# Patient Record
Sex: Male | Born: 1995 | Hispanic: No | Marital: Single | Smoking: Never smoker
Health system: Southern US, Community
[De-identification: ages and names within clinical notes are randomized; demographics above are authoritative.]

## PROBLEM LIST (undated history)

## (undated) DIAGNOSIS — F319 Bipolar disorder, unspecified: Secondary | ICD-10-CM

---
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
## 2013-10-13 ENCOUNTER — Emergency Department (HOSPITAL_COMMUNITY)
Admission: EM | Admit: 2013-10-13 | Discharge: 2013-10-14 | Disposition: A | Discharge status: Other Acute Inpatient Hospital | Payer: Managed Care, Other (non HMO) | Attending: Emergency Medicine | Admitting: Emergency Medicine

## 2013-10-13 ENCOUNTER — Encounter (HOSPITAL_COMMUNITY): Payer: Self-pay | Admitting: Emergency Medicine

## 2013-10-13 DIAGNOSIS — R45851 Suicidal ideations: Secondary | ICD-10-CM

## 2013-10-13 DIAGNOSIS — F3130 Bipolar disorder, current episode depressed, mild or moderate severity, unspecified: Secondary | ICD-10-CM

## 2013-10-13 DIAGNOSIS — F17200 Nicotine dependence, unspecified, uncomplicated: Secondary | ICD-10-CM | POA: Insufficient documentation

## 2013-10-13 DIAGNOSIS — Z7289 Other problems related to lifestyle: Secondary | ICD-10-CM

## 2013-10-13 DIAGNOSIS — F314 Bipolar disorder, current episode depressed, severe, without psychotic features: Secondary | ICD-10-CM

## 2013-10-13 DIAGNOSIS — X789XXA Intentional self-harm by unspecified sharp object, initial encounter: Secondary | ICD-10-CM | POA: Insufficient documentation

## 2013-10-13 DIAGNOSIS — S61509A Unspecified open wound of unspecified wrist, initial encounter: Secondary | ICD-10-CM | POA: Insufficient documentation

## 2013-10-13 DIAGNOSIS — F319 Bipolar disorder, unspecified: Secondary | ICD-10-CM | POA: Insufficient documentation

## 2013-10-13 HISTORY — DX: Bipolar disorder, unspecified: F31.9

## 2013-10-13 LAB — CBC
HEMATOCRIT: 40.8 % (ref 39.0–52.0)
HEMOGLOBIN: 14.6 g/dL (ref 13.0–17.0)
MCH: 30.5 pg (ref 26.0–34.0)
MCHC: 35.8 g/dL (ref 30.0–36.0)
MCV: 85.4 fL (ref 78.0–100.0)
PLATELETS: 263 10*3/uL (ref 150–400)
RBC: 4.78 MIL/uL (ref 4.22–5.81)
RDW: 12.6 % (ref 11.5–15.5)
WBC: 9.9 10*3/uL (ref 4.0–10.5)

## 2013-10-13 LAB — RAPID URINE DRUG SCREEN, HOSP PERFORMED
AMPHETAMINES: NOT DETECTED
BARBITURATES: NOT DETECTED
Benzodiazepines: NOT DETECTED
COCAINE: NOT DETECTED
OPIATES: NOT DETECTED
Tetrahydrocannabinol: NOT DETECTED

## 2013-10-13 LAB — COMPREHENSIVE METABOLIC PANEL
ALT: 12 U/L (ref 0–53)
AST: 23 U/L (ref 0–37)
Albumin: 4.5 g/dL (ref 3.5–5.2)
Alkaline Phosphatase: 84 U/L (ref 39–117)
BUN: 18 mg/dL (ref 6–23)
CALCIUM: 9.4 mg/dL (ref 8.4–10.5)
CO2: 21 meq/L (ref 19–32)
CREATININE: 1.01 mg/dL (ref 0.50–1.35)
Chloride: 100 mEq/L (ref 96–112)
Glucose, Bld: 101 mg/dL — ABNORMAL HIGH (ref 70–99)
Potassium: 3.9 mEq/L (ref 3.7–5.3)
SODIUM: 137 meq/L (ref 137–147)
Total Bilirubin: 0.7 mg/dL (ref 0.3–1.2)
Total Protein: 7.3 g/dL (ref 6.0–8.3)

## 2013-10-13 LAB — ETHANOL: Alcohol, Ethyl (B): <11 mg/dL (ref 0–11)

## 2013-10-13 LAB — SALICYLATE LEVEL: Salicylate Lvl: <2 mg/dL — ABNORMAL LOW (ref 2.8–20.0)

## 2013-10-13 LAB — ACETAMINOPHEN LEVEL: Acetaminophen (Tylenol), Serum: <15 ug/mL (ref 10–30)

## 2013-10-13 MED ORDER — NICOTINE 21 MG/24HR TD PT24: 21.0000 mg | MEDICATED_PATCH | Freq: Every day | TRANSDERMAL | Status: DC

## 2013-10-13 MED ORDER — ZOLPIDEM TARTRATE 5 MG PO TABS: 5.0000 mg | ORAL_TABLET | Freq: Every evening | ORAL | Status: DC | PRN

## 2013-10-13 MED ORDER — ONDANSETRON HCL 4 MG PO TABS: 4.0000 mg | ORAL_TABLET | Freq: Three times a day (TID) | ORAL | Status: DC | PRN

## 2013-10-13 MED ORDER — IBUPROFEN 400 MG PO TABS: 600.0000 mg | ORAL_TABLET | Freq: Three times a day (TID) | ORAL | Status: DC | PRN

## 2013-10-13 NOTE — BH Assessment (Signed)
Clinician contacted Ivonne AndrewPeter Dammen, PA to gather report prior to seeing pt.  Theron Aristaeter reported pt presenting suicidal with self inflicted cuts on arm. Pt has hx of depression. Assessment to be initiated.   Yaakov Guthrieelilah Stewart, MSW, LCSW Triage Specialist [phone]

## 2013-10-13 NOTE — ED Notes (Addendum)
Pt states he cut L wrist 1 hour ago because he was suicidal.  States. " I realized it was not deep enough and I was not brave enough to do it."  Denies AH/VH.  Approx 3 inch laceration to L wrist.  Bleeding controlled pta.  Bandage in place.

## 2013-10-13 NOTE — ED Notes (Signed)
MD informed of plan of care, [facility]. [facility] called and spoke with Delilah who will contact MD.

## 2013-10-13 NOTE — ED Notes (Signed)
Pt states he lives at home with his parents. States he works part time at [employer]. Admits to drinking 1-2 beers last week with a friend but states he doesn't normally drink any alcohol. He states he did smoke cigarettes last week. States he cut his wrist because "life sucks" states he should have talked to someone

## 2013-10-13 NOTE — Progress Notes (Addendum)
error 

## 2013-10-13 NOTE — BH Assessment (Signed)
Tele Assessment Note   [name] HugerCarmelo Leticia [name] is an 18 y.o. male who presents voluntarily to [facility] accompanied by his father.  Pt reported self inflicted cuts the night prior due to depressed feelings. Pt denied cutting as a suicide attempt. Pt stated depressed due to being stressed at work and he and his fiance' breaking up. Pt reported feeling overwhelmed. Pt denied current SI. Pt reported being prescribed Lamictal and Equitral by psychiatrist- Dr. Tiajuana AmassScott [name] but stated he has not taken meds in 5-6 months. Pt reported medication was not working and up until recently he was feeling better without medication. Pt reported he has not followed up with psychiatrist about not taking medication. Pt reported no previous inpatient hx and no previous cutting attempts.  Pt reported not having outpatient therapy as well. Pt reported occasional alcohol use and last use was a couple of days ago.   Pt Ox4. Pt denied HI, AVH and SA. Pt reported depressed mood and presented with congruent affect.  Pt and his dad were able to contract pt for safety. Pt's dad agreed to remove knives and other sharp objects until follow up with psychiatrist. Pt in agreance to follow up with referral to an outpatient provider.  Axis I: Bipolar, Depressed and (by history) Axis II: Deferred Axis III:  Past Medical History  Diagnosis Date  . Bipolar 1 disorder    Axis IV: other psychosocial or environmental problems and problems related to social environment  Past Medical History:  Past Medical History  Diagnosis Date  . Bipolar 1 disorder     History reviewed. No pertinent past surgical history.  Family History: No family history on file.  Social History:  reports that he has been smoking.  He does not have any smokeless tobacco history on file. He reports that he drinks alcohol. He reports that he does not use illicit drugs.  Additional Social History:  Alcohol / Drug Use Pain Medications: none currently Prescriptions:  Equital, Lamitical Over the Counter: none currently History of alcohol / drug use?: No history of alcohol / drug abuse  CIWA: CIWA-Ar BP: 134/82 mmHg Pulse Rate: 65 COWS:    Allergies: No Known Allergies  Home Medications:  (Not in a hospital admission)  OB/GYN Status:  No LMP for male patient.  General Assessment Data [facility] Is this a Tele or Face-to-Face Assessment?: Tele Assessment Is this an Initial Assessment or a Re-assessment for this encounter?: Initial Assessment Living Arrangements: Parent Can pt return to current living arrangement?: Yes Admission Status: Voluntary Is patient capable of signing voluntary admission?: Yes Transfer from: Acute Hospital Referral Source: Self/Family/Friend  Medical Screening Exam [facility] Walk-in ONLY) Medical Exam completed:  (NA)  [facility] Living Arrangements: Parent Name of Psychiatrist:  (Dr. Tiajuana AmassScott [name]) Name of Therapist:  (none)     Risk to self Suicidal Ideation: No-Not Currently/Within Last 6 Months Suicidal Intent: No Is patient at risk for suicide?: No Suicidal Plan?: No Access to Means: Yes Specify Access to Suicidal Means:  (Pt had passive SI cutting behaviors. ) What has been your use of drugs/alcohol within the last 12 months?:  (none ) Previous Attempts/Gestures: No How many times?: 0 Other Self Harm Risks:  (yes) Triggers for Past Attempts: None known Intentional Self Injurious Behavior: Cutting Comment - Self Injurious Behavior:  (Pt has self inflicted cuts on arm.) Family Suicide History: Unknown Recent stressful life event(s): Conflict (Comment);Other (Comment) (Pt broke up with girlfriend and increased stress at work. )  Persecutory voices/beliefs?: No Depression: Yes Depression Symptoms: Despondent;Tearfulness;Fatigue Substance abuse history and/or treatment for substance abuse?: No Suicide prevention information given to non-admitted patients: Yes  Risk to  Others Homicidal Ideation: No Thoughts of Harm to Others: No Current Homicidal Intent: No Current Homicidal Plan: No Access to Homicidal Means: No Identified Victim:  (NA) History of harm to others?: No Assessment of Violence: None Noted Violent Behavior Description:  (Pt was calm and cooperative. ) Does patient have access to weapons?: No Criminal Charges Pending?: No Does patient have a court date: No  Psychosis Hallucinations: None noted Delusions: None noted  Mental Status Report Appear/Hygiene: In scrubs Eye Contact: Fair Motor Activity: Unremarkable Speech: Logical/coherent Level of Consciousness: Alert Mood: Depressed Affect: Depressed Anxiety Level: Minimal Thought Processes: Coherent;Relevant Judgement: Impaired Orientation: Place;Person;Time;Situation Obsessive Compulsive Thoughts/Behaviors: Minimal  Cognitive Functioning Concentration: Normal Memory: Recent Intact;Remote Intact IQ: Average Insight: Poor Impulse Control: Poor Appetite: Fair Weight Loss:  (unk) Weight Gain:  (unk) Sleep: Increased Total Hours of Sleep: 8 Vegetative Symptoms: None  ADLScreening [facility] Assessment Services) Patient's cognitive ability adequate to safely complete daily activities?: Yes Patient able to express need for assistance with ADLs?: Yes Independently performs ADLs?: Yes (appropriate for developmental age)  Prior Inpatient Therapy Prior Inpatient Therapy: No Prior Therapy Dates:  (NA) Prior Therapy Facilty/Provider(s):  (NA) Reason for Treatment:  (NA)  Prior Outpatient Therapy Prior Outpatient Therapy: No Prior Therapy Dates:  (NA) Prior Therapy Facilty/Provider(s):  (NA) Reason for Treatment:  (NA)  ADL Screening (condition at time of admission) Patient's cognitive ability adequate to safely complete daily activities?: Yes Is the patient deaf or have difficulty hearing?: No Does the patient have difficulty seeing, even when wearing glasses/contacts?:  No Does the patient have difficulty concentrating, remembering, or making decisions?: No Patient able to express need for assistance with ADLs?: Yes Does the patient have difficulty dressing or bathing?: No Independently performs ADLs?: Yes (appropriate for developmental age)       Abuse/Neglect Assessment (Assessment to be complete while patient is alone) Physical Abuse: Denies Verbal Abuse: Denies Sexual Abuse: Denies Exploitation of patient/patient's resources: Denies Self-Neglect: Denies Values / Beliefs Cultural Requests During Hospitalization: None Spiritual Requests During Hospitalization: None   Advance Directives (For Healthcare) Advance Directive: Patient does not have advance directive    Additional Information 1:1 In Past 12 Months?: No CIRT Risk: No Elopement Risk: No Does patient have medical clearance?: Yes    Disposition: Clinician consulted with Alberteen SamFran Hobson, NP who reports pt does not meet criteria for inpatient admission.  Dr. Rhunette CroftNanavati requests pt be seen by psych in the AM prior to discharge.   Disposition Initial Assessment Completed for this Encounter: Yes Disposition of Patient: Referred to Patient referred to: Other (Comment) (EDP request pt to be seen my psych in the AM.)  Stewart,Samarra Ridgely R 10/13/2013 6:51 AM

## 2013-10-13 NOTE — ED Provider Notes (Signed)
CSN: [id number]     Arrival date & time 10/13/13  0255 History   First MD Initiated Contact with Patient 10/13/13 0414     Chief Complaint  Patient presents with  . Extremity Laceration  . Suicidal   HPI  History provided by the patient. The patient is a 18 year old male with past history of bipolar disorder presenting with worsening depression and suicidal ideation with laceration to left wrist. Patient states that his life has felt miserable and he is very depressed and not happy. He was at home and reports using a kitchen knife to cut his left wrist. He states he was feeling suicidal but also states he knows he was not to be able to cut himself deeply enough to die. Was seen by psychiatrist in the past for his bipolar depression symptoms but has not been taking any of the prescribed medicine for the past 5-6 months. He denies any hallucinations. No homicidal ideations. Denies any drug or alcohol use. No other aggravating or alleviating factors. No other associated symptoms.     Past Medical History  Diagnosis Date  . Bipolar 1 disorder    History reviewed. No pertinent past surgical history. No family history on file. History  Substance Use Topics  . Smoking status: Current Some Day Smoker  . Smokeless tobacco: Not on file  . Alcohol Use: Yes    Review of Systems  All other systems reviewed and are negative.     Allergies  Review of patient's allergies indicates no known allergies.  Home Medications   Prior to Admission medications   Medication Sig Start Date End Date Taking? Authorizing Provider  Multiple Vitamins-Minerals (MULTIVITAMIN WITH MINERALS) tablet Take 2 tablets by mouth daily. gummy   Yes Historical Provider, MD   BP 163/95  Pulse 73  Temp(Src) 98.2 F (36.8 C) (Oral)  Resp 18  SpO2 98% Physical Exam  Nursing note and vitals reviewed. Constitutional: He appears well-developed and well-nourished.  HENT:  Head: Normocephalic.  Cardiovascular: Normal  rate and regular rhythm.   Pulmonary/Chest: Effort normal and breath sounds normal. No respiratory distress.  Abdominal: Soft.  Musculoskeletal:  Superficial laceration across the left wrist. No deep structure involvement or tendon involvement. Normal grip strength and range of motion at the wrist. Normal distal pulses and capillary refill.  Psychiatric: He exhibits a depressed mood. He expresses suicidal ideation.    ED Course  Procedures   COORDINATION OF CARE:  Nursing notes reviewed. Vital signs reviewed. Initial pt interview and examination performed.   Filed Vitals:   10/13/13 0258  BP: 163/95  Pulse: 73  Temp: 98.2 F (36.8 C)  TempSrc: Oral  Resp: 18  SpO2: 98%    4:37 AM-Patient seen and evaluated. Patient does not appear in any acute distress. Does report suicidal ideations and self cutting.  TTS consult placed. Psychiatric holding orders in place.      LACERATION REPAIR Performed by: Angus SellerAMMEN,Crue Otero S Authorized by: Angus SellerAMMEN,Adaleen Hulgan S Consent: Verbal consent obtained. Risks and benefits: risks, benefits and alternatives were discussed Consent given by: patient Patient identity confirmed: provided demographic data Prepped and Draped in normal sterile fashion Wound explored  Laceration Location: left wirst  Laceration Length: 4.5cm  No Foreign Bodies seen or palpated  Anesthesia: local infiltration  Local anesthetic: lidocaine 2% without epinephrine  Anesthetic total: 2 ml  Irrigation method: syringe Amount of cleaning: standard  Skin closure: dermabond  Patient tolerance: Patient tolerated the procedure well with no immediate complications.   Results for  orders placed during the hospital encounter of 10/13/13  ACETAMINOPHEN LEVEL      Result Value Ref Range   Acetaminophen (Tylenol), Serum <15.0  10 - 30 ug/mL  CBC      Result Value Ref Range   WBC 9.9  4.0 - 10.5 K/uL   RBC 4.78  4.22 - 5.81 MIL/uL   Hemoglobin 14.6  13.0 - 17.0 g/dL   HCT  16.140.8  09.639.0 - 04.552.0 %   MCV 85.4  78.0 - 100.0 fL   MCH 30.5  26.0 - 34.0 pg   MCHC 35.8  30.0 - 36.0 g/dL   RDW 40.912.6  81.111.5 - 91.415.5 %   Platelets 263  150 - 400 K/uL  COMPREHENSIVE METABOLIC PANEL      Result Value Ref Range   Sodium 137  137 - 147 mEq/L   Potassium 3.9  3.7 - 5.3 mEq/L   Chloride 100  96 - 112 mEq/L   CO2 21  19 - 32 mEq/L   Glucose, Bld 101 (*) 70 - 99 mg/dL   BUN 18  6 - 23 mg/dL   Creatinine, Ser 7.821.01  0.50 - 1.35 mg/dL   Calcium 9.4  8.4 - 95.610.5 mg/dL   Total Protein 7.3  6.0 - 8.3 g/dL   Albumin 4.5  3.5 - 5.2 g/dL   AST 23  0 - 37 U/L   ALT 12  0 - 53 U/L   Alkaline Phosphatase 84  39 - 117 U/L   Total Bilirubin 0.7  0.3 - 1.2 mg/dL   GFR calc non Af Amer >90  >90 mL/min   GFR calc Af Amer >90  >90 mL/min  ETHANOL      Result Value Ref Range   Alcohol, Ethyl (B) <11  0 - 11 mg/dL  SALICYLATE LEVEL      Result Value Ref Range   Salicylate Lvl <2.0 (*) 2.8 - 20.0 mg/dL  URINE RAPID DRUG SCREEN (HOSP PERFORMED)      Result Value Ref Range   Opiates NONE DETECTED  NONE DETECTED   Cocaine NONE DETECTED  NONE DETECTED   Benzodiazepines NONE DETECTED  NONE DETECTED   Amphetamines NONE DETECTED  NONE DETECTED   Tetrahydrocannabinol NONE DETECTED  NONE DETECTED   Barbiturates NONE DETECTED  NONE DETECTED      MDM   Final diagnoses:  Suicidal ideation  Deliberate self-cutting       Angus Sellereter S Derak Schurman, PA-C 10/13/13 [phone]

## 2013-10-13 NOTE — Discharge Instructions (Signed)
Please followup with your doctor and psychiatrist for continued evaluation and treatment of your symptoms.    Depression, Adult Depression refers to feeling sad, low, down in the dumps, blue, gloomy, or empty. In general, there are two kinds of depression: 1. Depression that we all experience from time to time because of upsetting life experiences, including the loss of a job or the ending of a relationship (normal sadness or normal grief). This kind of depression is considered normal, is short lived, and resolves within a few days to 2 weeks. (Depression experienced after the loss of a loved one is called bereavement. Bereavement often lasts longer than 2 weeks but normally gets better with time.) 2. Clinical depression, which lasts longer than normal sadness or normal grief or interferes with your ability to function at home, at work, and in school. It also interferes with your personal relationships. It affects almost every aspect of your life. Clinical depression is an illness. Symptoms of depression also can be caused by conditions other than normal sadness and grief or clinical depression. Examples of these conditions are listed as follows:  Physical illness Some physical illnesses, including underactive thyroid gland (hypothyroidism), severe anemia, specific types of cancer, diabetes, uncontrolled seizures, heart and lung problems, strokes, and chronic pain are commonly associated with symptoms of depression.  Side effects of some prescription medicine In some people, certain types of prescription medicine can cause symptoms of depression.  Substance abuse Abuse of alcohol and illicit drugs can cause symptoms of depression. SYMPTOMS Symptoms of normal sadness and normal grief include the following:  Feeling sad or crying for short periods of time.  Not caring about anything (apathy).  Difficulty sleeping or sleeping too much.  No longer able to enjoy the things you used to  enjoy.  Desire to be by oneself all the time (social isolation).  Lack of energy or motivation.  Difficulty concentrating or remembering.  Change in appetite or weight.  Restlessness or agitation. Symptoms of clinical depression include the same symptoms of normal sadness or normal grief and also the following symptoms:  Feeling sad or crying all the time.  Feelings of guilt or worthlessness.  Feelings of hopelessness or helplessness.  Thoughts of suicide or the desire to harm yourself (suicidal ideation).  Loss of touch with reality (psychotic symptoms). Seeing or hearing things that are not real (hallucinations) or having false beliefs about your life or the people around you (delusions and paranoia). DIAGNOSIS  The diagnosis of clinical depression usually is based on the severity and duration of the symptoms. Your caregiver also will ask you questions about your medical history and substance use to find out if physical illness, use of prescription medicine, or substance abuse is causing your depression. Your caregiver also may order blood tests. TREATMENT  Typically, normal sadness and normal grief do not require treatment. However, sometimes antidepressant medicine is prescribed for bereavement to ease the depressive symptoms until they resolve. The treatment for clinical depression depends on the severity of your symptoms but typically includes antidepressant medicine, counseling with a mental health professional, or a combination of both. Your caregiver will help to determine what treatment is best for you. Depression caused by physical illness usually goes away with appropriate medical treatment of the illness. If prescription medicine is causing depression, talk with your caregiver about stopping the medicine, decreasing the dose, or substituting another medicine. Depression caused by abuse of alcohol or illicit drugs abuse goes away with abstinence from these substances. Some  adults need professional help in order to stop drinking or using drugs. SEEK IMMEDIATE CARE IF:  You have thoughts about hurting yourself or others.  You lose touch with reality (have psychotic symptoms).  You are taking medicine for depression and have a serious side effect. FOR MORE INFORMATION National Alliance on Mental Illness: www.nami.[employer]: http://www.maynard.net/ Document Released: 04/14/2000 Document Revised: 10/17/2011 Document Reviewed: 07/17/2011 [facility] Patient Information 2014 [place], [place].   Laceration Care, Adult A laceration is a cut or lesion that goes through all layers of the skin and into the tissue just beneath the skin. TREATMENT  Some lacerations may not require closure. Some lacerations may not be able to be closed due to an increased risk of infection. It is important to see your caregiver as soon as possible after an injury to minimize the risk of infection and maximize the opportunity for successful closure. If closure is appropriate, pain medicines may be given, if needed. The wound will be cleaned to help prevent infection. Your caregiver will use stitches (sutures), staples, wound glue (adhesive), or skin adhesive strips to repair the laceration. These tools bring the skin edges together to allow for faster healing and a better cosmetic outcome. However, all wounds will heal with a scar. Once the wound has healed, scarring can be minimized by covering the wound with sunscreen during the day for 1 full year. HOME CARE INSTRUCTIONS  For sutures or staples:  Keep the wound clean and dry.  If you were given a bandage (dressing), you should change it at least once a day. Also, change the dressing if it becomes wet or dirty, or as directed by your caregiver.  Wash the wound with soap and water 2 times a day. Rinse the wound off with water to remove all soap. Pat the wound dry with a clean towel.  After cleaning, apply a thin  layer of the antibiotic ointment as recommended by your caregiver. This will help prevent infection and keep the dressing from sticking.  You may shower as usual after the first 24 hours. Do not soak the wound in water until the sutures are removed.  Only take over-the-counter or prescription medicines for pain, discomfort, or fever as directed by your caregiver.  Get your sutures or staples removed as directed by your caregiver. For skin adhesive strips:  Keep the wound clean and dry.  Do not get the skin adhesive strips wet. You may bathe carefully, using caution to keep the wound dry.  If the wound gets wet, pat it dry with a clean towel.  Skin adhesive strips will fall off on their own. You may trim the strips as the wound heals. Do not remove skin adhesive strips that are still stuck to the wound. They will fall off in time. For wound adhesive:  You may briefly wet your wound in the shower or bath. Do not soak or scrub the wound. Do not swim. Avoid periods of heavy perspiration until the skin adhesive has fallen off on its own. After showering or bathing, gently pat the wound dry with a clean towel.  Do not apply liquid medicine, cream medicine, or ointment medicine to your wound while the skin adhesive is in place. This may loosen the film before your wound is healed.  If a dressing is placed over the wound, be careful not to apply tape directly over the skin adhesive. This may cause the adhesive to be pulled off before the wound is healed.  Avoid prolonged exposure to sunlight or tanning lamps while the skin adhesive is in place. Exposure to ultraviolet light in the first year will darken the scar.  The skin adhesive will usually remain in place for 5 to 10 days, then naturally fall off the skin. Do not pick at the adhesive film. You may need a tetanus shot if:  You cannot remember when you had your last tetanus shot.  You have never had a tetanus shot. If you get a tetanus  shot, your arm may swell, get red, and feel warm to the touch. This is common and not a problem. If you need a tetanus shot and you choose not to have one, there is a rare chance of getting tetanus. Sickness from tetanus can be serious. SEEK MEDICAL CARE IF:   You have redness, swelling, or increasing pain in the wound.  You see a red line that goes away from the wound.  You have yellowish-white fluid (pus) coming from the wound.  You have a fever.  You notice a bad smell coming from the wound or dressing.  Your wound breaks open before or after sutures have been removed.  You notice something coming out of the wound such as wood or glass.  Your wound is on your hand or foot and you cannot move a finger or toe. SEEK IMMEDIATE MEDICAL CARE IF:   Your pain is not controlled with prescribed medicine.  You have severe swelling around the wound causing pain and numbness or a change in color in your arm, hand, leg, or foot.  Your wound splits open and starts bleeding.  You have worsening numbness, weakness, or loss of function of any joint around or beyond the wound.  You develop painful lumps near the wound or on the skin anywhere on your body. MAKE SURE YOU:   Understand these instructions.  Will watch your condition.  Will get help right away if you are not doing well or get worse. Document Released: 04/17/2005 Document Revised: 07/10/2011 Document Reviewed: 10/11/2010 [facility] Patient Information 2014 [place], [place].

## 2013-10-13 NOTE — ED Notes (Signed)
[facility] if pt signs a no harm contract and follows up with a psychiatrist within 10 days. Pt states he will comply. Pt's father in agreement.

## 2013-10-13 NOTE — BH Assessment (Signed)
Clinician followed up with Dr. Rhunette CroftNanavati about discharge plan. Dr requests pt to be seen by psych to discuss current med non-compliance and safety issues prior to discharge from hospital.   [name] [name], MSW, LCSW Triage Specialist [phone]

## 2013-10-13 NOTE — BH Assessment (Signed)
Patient is pending a telepsych. Writer notified Renata Capriceonrad, NP of telepsych need. Sts that he will now being doing telepsych's after 12 noon for the ED's as he was directed to do so by Dr. Lucianne MussKumar. Writer notified patient's nurse-Karen. Writer will touch base with patient's nurse regarding telepsych time around noon.

## 2013-10-13 NOTE — ED Notes (Signed)
Breakfast tray ordered for pt

## 2013-10-13 NOTE — ED Notes (Signed)
PA suturing pt's laceration at this time.

## 2013-10-13 NOTE — BH Assessment (Signed)
Clinician consulted with [name] SamFran Hobson, NP who reports pt does not meet criteria for inpatient admission. Pt able to contract for safety. [name] [name] recommends pt follow up with psychiatrist within 10 days, connect with outpatient provider for therapy and sign no harm contract. Clinician will fax information to [name] JoinerRebecca, RN to provide to patient upon discharge. Clinician provided updates to [name] AndrewPeter Dammen, PA who agrees with recommendations.   [name] [name], MSW, LCSW Triage Specialist [phone]

## 2013-10-13 NOTE — Progress Notes (Signed)
P 

## 2013-10-13 NOTE — ED Notes (Signed)
MD at bedside. 

## 2013-10-13 NOTE — ED Notes (Signed)
TTS at BS 

## 2013-10-13 NOTE — Consult Note (Signed)
[facility] Face-to-Face Psychiatry Consult   Reason for Consult:  Bipolar and SIB and Suicidal attempt Referring Physician:  Dr. Halina Andreas Villamar is an 17 y.o. male. Total Time spent with patient: 45 minutes  Assessment: AXIS I:  Bipolar, Depressed AXIS II:  Deferred AXIS III:   Past Medical History  Diagnosis Date  . Bipolar 1 disorder    AXIS IV:  other psychosocial or environmental problems, problems related to social environment and problems with primary support group AXIS V:  41-50 serious symptoms  Plan:  Recommend psychiatric Inpatient admission when medically cleared. Supportive therapy provided about ongoing stressors.  Subjective:   [name] [name] is a 18 y.o. male patient admitted with bipolar and suicidal attempt / SIB.  HPI:  Patient is seen, chart reviewed, and case discussed with patient dad who is at bed side. Patient has been struggling with increased symptoms of depression, anxiety, anger and been stressed about stress from work x 3 weeks and broke up with relationship x 3 years. Patient has been hopeless, helpless and felt worthless and has disturbed sleep and has planned suicidal attempt. He has cut on his wrist several time with razor blade in shower and than later put ice to numb his fore arm and cut with kitchen knife in his room and than woke up his dad to seek help. Patient reportedly received unknown medication for anxiety from a PCP which did not help anxiety but helped nausea and vomiting about three years ago and than was referred to psychiatrist who provided out patient care about two years but failed to comply with medication saying he was continue to be angry on his bipolar medications. He was taken his medication about six months ago. Reportedly he is one of the five siblings and older one. He has stated that he does not know if he is suicidal or not when asked during the evaluation. Patient and his father agree with in patient hospitalization for safety,  appropriate medication management and diagnostic clarification and crisis stabilization. Patient has no history of substance abuse and no previous psych admission.   HPI Elements:   Location:  depression and mood swings. Quality:  poor. Severity:  acute. Timing:  suicidal attmpt.  Past Psychiatric History: Past Medical History  Diagnosis Date  . Bipolar 1 disorder     reports that he has been smoking.  He does not have any smokeless tobacco history on file. He reports that he drinks alcohol. He reports that he does not use illicit drugs. No family history on file. Family History Substance Abuse: No Family Supports: Yes, List: (dad and mom) Living Arrangements: Parent Can pt return to current living arrangement?: Yes Abuse/Neglect [facility]) Physical Abuse: Denies Verbal Abuse: Denies Sexual Abuse: Denies Allergies:  No Known Allergies  ACT Assessment Complete:  Yes:    Educational Status    Risk to Self: Risk to self Suicidal Ideation: No-Not Currently/Within Last 6 Months Suicidal Intent: No Is patient at risk for suicide?: No Suicidal Plan?: No Access to Means: Yes Specify Access to Suicidal Means:  (Pt had passive SI cutting behaviors. ) What has been your use of drugs/alcohol within the last 12 months?:  (none ) Previous Attempts/Gestures: No How many times?: 0 Other Self Harm Risks:  (yes) Triggers for Past Attempts: None known Intentional Self Injurious Behavior: Cutting Comment - Self Injurious Behavior:  (Pt has self inflicted cuts on arm.) Family Suicide History: Unknown Recent stressful life event(s): Conflict (Comment);Other (Comment) (Pt broke up with girlfriend  and increased stress at work. ) Persecutory voices/beliefs?: No Depression: Yes Depression Symptoms: Despondent;Tearfulness;Fatigue Substance abuse history and/or treatment for substance abuse?: No Suicide prevention information given to non-admitted patients: Yes  Risk to Others: Risk to  Others Homicidal Ideation: No Thoughts of Harm to Others: No Current Homicidal Intent: No Current Homicidal Plan: No Access to Homicidal Means: No Identified Victim:  (NA) History of harm to others?: No Assessment of Violence: None Noted Violent Behavior Description:  (Pt was calm and cooperative. ) Does patient have access to weapons?: No Criminal Charges Pending?: No Does patient have a court date: No  Abuse: Abuse/Neglect Assessment (Assessment to be complete while patient is alone) Physical Abuse: Denies Verbal Abuse: Denies Sexual Abuse: Denies Exploitation of patient/patient's resources: Denies Self-Neglect: Denies  Prior Inpatient Therapy: Prior Inpatient Therapy Prior Inpatient Therapy: No Prior Therapy Dates:  (NA) Prior Therapy Facilty/Provider(s):  (NA) Reason for Treatment:  (NA)  Prior Outpatient Therapy: Prior Outpatient Therapy Prior Outpatient Therapy: No Prior Therapy Dates:  (NA) Prior Therapy Facilty/Provider(s):  (NA) Reason for Treatment:  (NA)  Additional Information: Additional Information 1:1 In Past 12 Months?: No CIRT Risk: No Elopement Risk: No Does patient have medical clearance?: Yes    Objective: Blood pressure 123/67, pulse 70, temperature 98.2 F (36.8 C), temperature source Oral, resp. rate 26, SpO2 99.00%.There is no height or weight on file to calculate BMI. Results for orders placed during the hospital encounter of 10/13/13 (from the past 72 hour(s))  ACETAMINOPHEN LEVEL     Status: None   Collection Time    10/13/13  3:16 AM      Result Value Ref Range   Acetaminophen (Tylenol), Serum <15.0  10 - 30 ug/mL   Comment:            THERAPEUTIC CONCENTRATIONS VARY     SIGNIFICANTLY. A RANGE OF 10-30     ug/mL MAY BE AN EFFECTIVE     CONCENTRATION FOR MANY PATIENTS.     HOWEVER, SOME ARE BEST TREATED     AT CONCENTRATIONS OUTSIDE THIS     RANGE.     ACETAMINOPHEN CONCENTRATIONS     >150 ug/mL AT 4 HOURS AFTER     INGESTION AND >50  ug/mL AT 12     HOURS AFTER INGESTION ARE     OFTEN ASSOCIATED WITH TOXIC     REACTIONS.  CBC     Status: None   Collection Time    10/13/13  3:16 AM      Result Value Ref Range   WBC 9.9  4.0 - 10.5 K/uL   RBC 4.78  4.22 - 5.81 MIL/uL   Hemoglobin 14.6  13.0 - 17.0 g/dL   HCT 40.8  39.0 - 52.0 %   MCV 85.4  78.0 - 100.0 fL   MCH 30.5  26.0 - 34.0 pg   MCHC 35.8  30.0 - 36.0 g/dL   RDW 12.6  11.5 - 15.5 %   Platelets 263  150 - 400 K/uL  COMPREHENSIVE METABOLIC PANEL     Status: Abnormal   Collection Time    10/13/13  3:16 AM      Result Value Ref Range   Sodium 137  137 - 147 mEq/L   Potassium 3.9  3.7 - 5.3 mEq/L   Comment: HEMOLYSIS AT THIS LEVEL MAY AFFECT RESULT   Chloride 100  96 - 112 mEq/L   CO2 21  19 - 32 mEq/L   Glucose, Bld 101 (*) 70 -  99 mg/dL   BUN 18  6 - 23 mg/dL   Creatinine, Ser 1.01  0.50 - 1.35 mg/dL   Calcium 9.4  8.4 - 10.5 mg/dL   Total Protein 7.3  6.0 - 8.3 g/dL   Albumin 4.5  3.5 - 5.2 g/dL   AST 23  0 - 37 U/L   ALT 12  0 - 53 U/L   Alkaline Phosphatase 84  39 - 117 U/L   Total Bilirubin 0.7  0.3 - 1.2 mg/dL   GFR calc non Af Amer >90  >90 mL/min   GFR calc Af Amer >90  >90 mL/min   Comment: (NOTE)     The eGFR has been calculated using the CKD EPI equation.     This calculation has not been validated in all clinical situations.     eGFR's persistently <90 mL/min signify possible Chronic Kidney     Disease.  ETHANOL     Status: None   Collection Time    10/13/13  3:16 AM      Result Value Ref Range   Alcohol, Ethyl (B) <11  0 - 11 mg/dL   Comment:            LOWEST DETECTABLE LIMIT FOR     SERUM ALCOHOL IS 11 mg/dL     FOR MEDICAL PURPOSES ONLY  SALICYLATE LEVEL     Status: Abnormal   Collection Time    10/13/13  3:16 AM      Result Value Ref Range   Salicylate Lvl <9.2 (*) 2.8 - 20.0 mg/dL  URINE RAPID DRUG SCREEN (HOSP PERFORMED)     Status: None   Collection Time    10/13/13  4:27 AM      Result Value Ref Range   Opiates NONE  DETECTED  NONE DETECTED   Cocaine NONE DETECTED  NONE DETECTED   Benzodiazepines NONE DETECTED  NONE DETECTED   Amphetamines NONE DETECTED  NONE DETECTED   Tetrahydrocannabinol NONE DETECTED  NONE DETECTED   Barbiturates NONE DETECTED  NONE DETECTED   Comment:            DRUG SCREEN FOR MEDICAL PURPOSES     ONLY.  IF CONFIRMATION IS NEEDED     FOR ANY PURPOSE, NOTIFY LAB     WITHIN 5 DAYS.                LOWEST DETECTABLE LIMITS     FOR URINE DRUG SCREEN     Drug Class       Cutoff (ng/mL)     Amphetamine      1000     Barbiturate      200     Benzodiazepine   330     Tricyclics       076     Opiates          300     Cocaine          300     THC              50   Labs are reviewed and are pertinent for WNL.  Current Facility-Administered Medications  Medication Dose Route Frequency Provider Last Rate Last Dose  . ibuprofen (ADVIL,MOTRIN) tablet 600 mg  600 mg Oral Q8H PRN Ruthell Rummage Dammen, PA-C      . nicotine (NICODERM CQ - dosed in mg/24 hours) patch 21 mg  21 mg Transdermal Daily Ruthell Rummage Dammen, PA-C      .  ondansetron (ZOFRAN) tablet 4 mg  4 mg Oral Q8H PRN Ruthell Rummage Dammen, PA-C      . zolpidem (AMBIEN) tablet 5 mg  5 mg Oral QHS PRN Martie Lee, PA-C       Current Outpatient Prescriptions  Medication Sig Dispense Refill  . Multiple Vitamins-Minerals (MULTIVITAMIN WITH MINERALS) tablet Take 2 tablets by mouth daily. gummy        Psychiatric Specialty Exam: Physical Exam Full physical performed in Emergency Department. I have reviewed this assessment and concur with its findings.   Review of Systems  Psychiatric/Behavioral: Positive for depression and suicidal ideas. The patient is nervous/anxious and has insomnia.   All other systems reviewed and are negative.   Blood pressure 123/67, pulse 70, temperature 98.2 F (36.8 C), temperature source Oral, resp. rate 26, SpO2 99.00%.There is no height or weight on file to calculate BMI.  General Appearance: Guarded and  laceration on his left wrist which required sutures  Eye Contact::  Good  Speech:  Clear and Coherent  Volume:  Decreased  Mood:  Angry, Anxious, Depressed, Hopeless, Irritable and Worthless  Affect:  Depressed and Flat  Thought Process:  Coherent and Goal Directed  Orientation:  Full (Time, Place, and Person)  Thought Content:  Rumination  Suicidal Thoughts:  Yes.  with intent/plan  Homicidal Thoughts:  No  Memory:  Immediate;   Good Recent;   Good  Judgement:  Impaired  Insight:  Lacking  Psychomotor Activity:  Decreased  Concentration:  Fair  Recall:  Good  Fund of Knowledge:Good  Language: Good  Akathisia:  NA  Handed:  Right  AIMS (if indicated):     Assets:  Communication Skills Desire for Improvement Financial Resources/Insurance Housing Leisure [place] Talents/Skills Transportation  Sleep:      Musculoskeletal: Strength & Muscle Tone: within normal limits Gait & Station: normal Patient leans: N/A  Treatment Plan Summary: Daily contact with patient to assess and evaluate symptoms and progress in treatment Medication management  [name] [name],JANARDHAHA R. 10/13/2013 12:08 PM

## 2013-10-14 ENCOUNTER — Encounter (HOSPITAL_COMMUNITY): Payer: Self-pay | Admitting: Intensive Care

## 2013-10-14 ENCOUNTER — Inpatient Hospital Stay (HOSPITAL_COMMUNITY)
Admission: AD | Admit: 2013-10-14 | Discharge: 2013-10-17 | DRG: 885 | Disposition: A | Discharge status: Home / Self Care | Payer: Managed Care, Other (non HMO) | Source: Intra-hospital | Attending: Psychiatry | Admitting: Psychiatry

## 2013-10-14 DIAGNOSIS — F339 Major depressive disorder, recurrent, unspecified: Secondary | ICD-10-CM

## 2013-10-14 DIAGNOSIS — F314 Bipolar disorder, current episode depressed, severe, without psychotic features: Principal | ICD-10-CM | POA: Diagnosis present

## 2013-10-14 DIAGNOSIS — F17200 Nicotine dependence, unspecified, uncomplicated: Secondary | ICD-10-CM | POA: Diagnosis present

## 2013-10-14 DIAGNOSIS — F332 Major depressive disorder, recurrent severe without psychotic features: Secondary | ICD-10-CM

## 2013-10-14 DIAGNOSIS — G4700 Insomnia, unspecified: Secondary | ICD-10-CM | POA: Diagnosis present

## 2013-10-14 DIAGNOSIS — F4322 Adjustment disorder with anxiety: Secondary | ICD-10-CM | POA: Diagnosis present

## 2013-10-14 DIAGNOSIS — F331 Major depressive disorder, recurrent, moderate: Secondary | ICD-10-CM

## 2013-10-14 MED ORDER — ACETAMINOPHEN 325 MG PO TABS: 650.0000 mg | ORAL_TABLET | Freq: Four times a day (QID) | ORAL | Status: DC | PRN

## 2013-10-14 MED ORDER — HYDROXYZINE HCL 25 MG PO TABS: 25.0000 mg | ORAL_TABLET | Freq: Four times a day (QID) | ORAL | Status: DC | PRN

## 2013-10-14 MED ORDER — TRAZODONE HCL 50 MG PO TABS
50.0000 mg | ORAL_TABLET | Freq: Every evening | ORAL | Status: DC | PRN
  Administered 2013-10-14 – 2013-10-16 (×3): 50 mg via ORAL
  Filled 2013-10-14 (×11): qty 1

## 2013-10-14 MED ORDER — ALUM & MAG HYDROXIDE-SIMETH 200-200-20 MG/5ML PO SUSP: 30.0000 mL | ORAL | Status: DC | PRN

## 2013-10-14 MED ORDER — MAGNESIUM HYDROXIDE 400 MG/5ML PO SUSP: 30.0000 mL | Freq: Every day | ORAL | Status: DC | PRN

## 2013-10-14 MED ORDER — ADULT MULTIVITAMIN W/MINERALS CH
1.0000 | ORAL_TABLET | Freq: Every day | ORAL | Status: DC
  Administered 2013-10-16 – 2013-10-17 (×2): 1 via ORAL
  Filled 2013-10-14 (×7): qty 1

## 2013-10-14 NOTE — ED Notes (Signed)
Patient has signed consent to go to [facility].

## 2013-10-14 NOTE — ED Notes (Signed)
Patient belongings sent with him to [facility] via [facility]

## 2013-10-14 NOTE — Progress Notes (Signed)
Patient denied SI and HI.  Contracts for safety.  Denied A/V hallucinations.  Denied pain.  Stated he would let nurse know if he needed any medications/assistance.  Patient went to recreation this afternoon.  Smiling and talking to peers on unit.  Pleasant and cooperative.

## 2013-10-14 NOTE — ED Provider Notes (Signed)
[facility] to [facility].   No current distress, vital signs are unremarkable.   Gerhard Munchobert Lockwood, MD 10/14/13 1426

## 2013-10-14 NOTE — ED Provider Notes (Signed)
Shared service with midlevel provider. I have personally seen and examined the patient, providing direct face to face care, presenting with the chief complaint of suicidal ideation and cut to the left wrist. Physical exam findings include left wrist injury- already stitched up. Patient is not taking his bipoar meds, and seems like a few negative events just occurred in his life that triggered such reaction. Plan will be for Psychiatrist to assess the patient and help with disposition. I have asked the behavioral team that a Psychiatrist needs to fully assess this patient. I have reviewed the nursing documentation on past medical history, family history, and social history.   Derwood KaplanAnkit Arliss Hepburn, MD 10/14/13 [phone]

## 2013-10-14 NOTE — ED Notes (Signed)
Called [facility] for transport.  

## 2013-10-14 NOTE — Progress Notes (Signed)
D: Patient in the dayroom on approach.  Patient animated and cooperative.  Patient had minimal interaction with Buyer, retailwriter tonight.  Patient states he is still trying to get adjusted to the unit.  Patient currently denies SI/HI and denies AVH.  Patient states he wants to make sure his depression never gets out of hand like it did prior to admission and states he needs to learn coping mechanisms. A: Staff to monitor Q 15 mins for safety.  Encouragement and support offered.  Scheduled medications administered per orders. R: Patient remains safe on the unit.  Patient attended group tonight.  Patient visible on hte unit and interacting with peers.  Patient taking administered medications.

## 2013-10-14 NOTE — Tx Team (Signed)
Initial Interdisciplinary Treatment Plan  PATIENT STRENGTHS: (choose at least two) Ability for insight Active sense of humor Average or above average intelligence Capable of independent living Communication skills Motivation for treatment/growth Supportive family/friends  PATIENT STRESSORS: Financial difficulties Marital or family conflict Medication change or noncompliance   PROBLEM LIST: Problem List/Patient Goals Date to be addressed Date deferred Reason deferred Estimated date of resolution  Depression 10/14/13                                                      DISCHARGE CRITERIA:  Ability to meet basic life and health needs Adequate post-discharge living arrangements Improved stabilization in mood, thinking, and/or behavior Medical problems require only outpatient monitoring  PRELIMINARY DISCHARGE PLAN: Attend aftercare/continuing care group Outpatient therapy  PATIENT/FAMIILY INVOLVEMENT: This treatment plan has been presented to and reviewed with the patient, [name] [name].  The patient and family has been given the opportunity to ask questions and make suggestions.  Nestor RampDUNCAN, TROY [facility] 10/14/2013, 3:29 PM

## 2013-10-14 NOTE — Progress Notes (Signed)
Adult Psychoeducational Group Note  Date:  10/14/2013 Time:  10:31 PM  Group Topic/Focus:  Wrap-Up Group:   The focus of this group is to help patients review their daily goal of treatment and discuss progress on daily workbooks.  Participation Level:  Active  Participation Quality:  Appropriate and Attentive  Affect:  Appropriate  Cognitive:  Alert  Insight: Good  Engagement in Group:  Supportive  Modes of Intervention:  Support  Additional Comments:  PT was in group and shared personal information   Pettress, Delton R 10/14/2013, 10:31 PM

## 2013-10-15 DIAGNOSIS — F4323 Adjustment disorder with mixed anxiety and depressed mood: Secondary | ICD-10-CM

## 2013-10-15 DIAGNOSIS — F329 Major depressive disorder, single episode, unspecified: Secondary | ICD-10-CM

## 2013-10-15 MED ORDER — CITALOPRAM HYDROBROMIDE 10 MG PO TABS
10.0000 mg | ORAL_TABLET | Freq: Every day | ORAL | Status: DC
  Administered 2013-10-16 – 2013-10-17 (×2): 10 mg via ORAL
  Filled 2013-10-15 (×6): qty 1

## 2013-10-15 NOTE — Progress Notes (Signed)
Adult Psychoeducational Group Note  Date:  10/15/2013 Time:  5:54 PM  Group Topic/Focus:  Personal Choices and Values:   The focus of this group is to help patients assess and explore the importance of values in their lives, how their values affect their decisions, how they express their values and what opposes their expression.  Participation Level:  Active  Participation Quality:  Attentive and Supportive  Affect:  Flat  Cognitive:  Alert and Appropriate  Insight: Good   Additional Comments:  Pt identified having companionship as a key value in his life.   Reynolds BowlClement, Dorothy D 10/15/2013, 5:54 PM

## 2013-10-15 NOTE — Progress Notes (Signed)
D: Patient states he had a good day.  Patient state she was bale to talk to the doctor today and states tomorrow he will start taking an antidepressant.  Patient states he hopes each day gets better and better.  Patient denies SI/HI and denies AVH. A: Staff to monitor Q 15 mins for safety.  Encouragement and support offered.  Scheduled medications administered per orders. R: Patient remains safe on the unit.  Patient attended group tonight.  Patient visible on the unit and interacting with peers.  Patient taking administered medications.

## 2013-10-15 NOTE — [facility] Suicide Risk Assessment (Signed)
   Nursing information obtained from:  Patient Demographic factors:  Male;Adolescent or young adult Current Mental Status:  Self-harm thoughts Loss Factors:  Loss of significant relationship Historical Factors:  NA Risk Reduction Factors:  Sense of responsibility to family;Employed;Positive social support;Positive therapeutic relationship Total Time spent with patient: 45 minutes  CLINICAL FACTORS:   Depression:   Anhedonia  Psychiatric Specialty Exam: Physical Exam  ROS  Blood pressure 108/69, pulse 102, temperature 97.3 F (36.3 C), temperature source Oral, resp. rate 20, height 5\' 6"  (1.676 m), weight 63.504 kg (140 lb), SpO2 100.00%.Body mass index is 22.61 kg/(m^2).  SEE ADMIT NOTE  MSE   COGNITIVE FEATURES THAT CONTRIBUTE TO RISK:  Polarized thinking    SUICIDE RISK:   Moderate:  Frequent suicidal ideation with limited intensity, and duration, some specificity in terms of plans, no associated intent, good self-control, limited dysphoria/symptomatology, some risk factors present, and identifiable protective factors, including available and accessible social support.  PLAN OF CARE:Patient will be admitted to inpatient psychiatric unit for stabilization and safety. Will provide and encourage milieu participation. Provide medication management and maked adjustments as needed.  Will follow daily.    I certify that inpatient services furnished can reasonably be expected to improve the patient's condition.  COBOS, FERNANDO 10/15/2013, 4:07 PM

## 2013-10-15 NOTE — Progress Notes (Signed)
Adult Activity Group Note  Date: 10/16/2015  Time:10am  Group Topic: Art  Participation Level: Minimal but responded when asked questions  Participation Quality: Attentive  Affect:  Blunted and Flat  Activity: Patients asked to create art to coincide with daily unit theme using any combination of markers, crayons, color pencils, construction paper, magazine clippings, glue, and scissors.   Additional Comments: Pts explored summertime and the activities they would like to explore when they leave the hospital. Pts created a list on poster board.   This pt spoke of wanting to explore what makes him happy this summer.

## 2013-10-15 NOTE — Progress Notes (Signed)
D: Patient presents with appropriate affect and pleasant mood. He reported on the self inventory sheet that he's sleeping fair, good appetite, normal energy level and improving ability to pay attention. Patient rates depression "5" and feelings of hopelessness "3". He's actively participating in groups, visible in the milieu and interactive with peers. Patient has not been prescribed any psychotropic medications at this time, only a multivitamin; he refused the medication this morning.  A: Support and encouragement provided to patient. Attempted to administer medication to pt. Monitor Q15 minute checks for safety.  R: Patient receptive. Denies SI/HI and AVH. Patient remains safe on the unit.

## 2013-10-15 NOTE — [facility] Group Notes (Signed)
[facility] Dis[facility] Note   10/15/2013 8:45 AM  Participation Quality:  Alert, Appropriate and Oriented  Mood/Affect:  Calm  Depression Rating:  2-3  Anxiety Rating:  5  Thoughts of Suicide:  Pt denies SI/HI  Will you contract for safety?   Yes  Current AVH:  Pt denies  Plan for Discharge/Comments:  Pt attended discharge planning group and actively participated in group.  CSW provided pt with today's workbook.  Pt reports feeling okay today.  [place] and reports seeing Dr. Tomasa Randunningham for outpatient medication management.  CSW will secure pt's follow up.  No further needs voiced by pt at this time.    Transportation Means: Pt reports access to transportation - parents will pick pt up at d/c  Supports: No supports mentioned at this time  Reyes IvanChelsea Horton, LCSW 10/15/2013 9:54 AM

## 2013-10-15 NOTE — [facility] Suicide Risk Assessment (Signed)
[facility] INPATIENT:  Family/Significant Other Suicide Prevention Education  Suicide Prevention Education:  Education Completed; [place] NickelLisa Verry Madoni - mother [phone]),  (name of family member/significant other) has been identified by the patient as the family member/significant other with whom the patient will be residing, and identified as the person(s) who will aid the patient in the event of a mental health crisis (suicidal ideations/suicide attempt).  With written consent from the patient, the family member/significant other has been provided the following suicide prevention education, prior to the and/or following the discharge of the patient.  The suicide prevention education provided includes the following:  Suicide risk factors  Suicide prevention and interventions  National Suicide Hotline telephone number  [facility] assessment telephone number  [place] Emergency Assistance 911  [facility] and/or Residential Mobile Crisis Unit telephone number  Request made of family/significant other to:  Remove weapons (e.g., guns, rifles, knives), all items previously/currently identified as safety concern.    Remove drugs/medications (over-the-counter, prescriptions, illicit drugs), all items previously/currently identified as a safety concern.  The family member/significant other verbalizes understanding of the suicide prevention education information provided.  The family member/significant other agrees to remove the items of safety concern listed above.  Carmina MillerHorton, Alyxandra Tenbrink Nicole 10/15/2013, 3:17 PM

## 2013-10-15 NOTE — [facility] Group Notes (Signed)
[facility] LCSW Group Therapy  10/15/2013  1:15 PM   Type of Therapy:  Group Therapy  Participation Level:  Active  Participation Quality:  Attentive, Sharing and Supportive  Affect:  Calm  Cognitive:  Alert and Oriented  Insight:  Developing/Improving and Engaged  Engagement in Therapy:  Developing/Improving and Engaged  Modes of Intervention:  Clarification, Confrontation, Discussion, Education, Exploration, Limit-setting, Orientation, Problem-solving, Rapport Building, Dance movement psychotherapisteality Testing, Socialization and Support  Summary of Progress/Problems: The topic for group today was emotional regulation.  This group focused on both positive and negative emotion identification and allowed group members to process ways to identify feelings, regulate negative emotions, and find healthy ways to manage internal/external emotions. Group members were asked to reflect on a time when their reaction to an emotion led to a negative outcome and explored how alternative responses using emotion regulation would have benefited them. Group members were also asked to discuss a time when emotion regulation was utilized when a negative emotion was experienced.  Pt shared that he struggles when he feels a lack of control and worries when he is anticipating something.  Pt was able to process what led to his admission, sharing the recent break up with his fiance, job stress and now feeling like he had support, which all led to him cutting himself.  Pt was able to identify this wasn't a good reaction to the stress but states that he doesn't think he could've changed how he reacted, looking back.  Pt actively participated and was engaged in group discussion.    Reyes IvanChelsea Horton, LCSW 10/15/2013  2:38 PM

## 2013-10-15 NOTE — Progress Notes (Signed)
Adult Psychoeducational Group Note  Date:  10/15/2013 Time:  9:26 PM  Group Topic/Focus:  Wrap-Up Group:   The focus of this group is to help patients review their daily goal of treatment and discuss progress on daily workbooks.  Participation Level:  Active  Participation Quality:  Appropriate  Affect:  Appropriate  Cognitive:  Appropriate  Insight: Appropriate  Engagement in Group:  Engaged  Modes of Intervention:  Support  Additional Comments:  Pt stated that positive thing that happened was that he made progress today and that compared to yesterday that he is doing better and that he plans to continue to do so every day  Ciceron, Ritchie 10/15/2013, 9:26 PM
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
## 2013-10-16 NOTE — Progress Notes (Signed)
D: Patient continues to have appropriate affect and pleasant mood. He reported on the self inventory sheet that he's sleeping fair, has good appetite, normal energy level and improving ability to pay attention. Patient rated depression "4" and feelings of hopelessness "5". He's attending scheduled groups sessions; writer observed that the patient has been laughing and conversing with other peers in the dayroom. Patient compliant with current medication regimen.  A: Support and encouragement provided to patient. Scheduled medications administered per MD orders. Maintain Q15 minute checks for safety.  R: Patient receptive. Denies SI/HI and auditory/visual hallucinations. Patient remains safe.

## 2013-10-16 NOTE — Progress Notes (Signed)
Adult Psychoeducational Group Note  Date:  10/16/2013 Time:  11:25 AM  Group Topic/Focus:  Overcoming Stress:   The focus of this group is to define stress and help patients assess their triggers.  Participation Level:  Active  Participation Quality:  Appropriate  Affect:  Appropriate  Cognitive:  Alert, Appropriate and Oriented  Insight: Good  Engagement in Group:  Engaged and Supportive  Modes of Intervention:  Discussion, Education and Support  Additional Comments:  Pt sat in the front of the group and had already been working on the day's booklet on stress.  Reynolds BowlClement, Dorothy D 10/16/2013, 11:25 AM

## 2013-10-16 NOTE — Progress Notes (Signed)
Adult Psychoeducational Group Note  Date:  10/16/2013 Time:  9:00 AM  Group Topic/Focus:  Morning Wellness Group  Participation Level:  Active  Participation Quality:  Attentive  Affect:  Appropriate  Cognitive:  Alert and Oriented  Insight: Good  Engagement in Group:  Engaged  Modes of Intervention:  Discussion  Additional Comments: Patient's goal is to center himself around more positive things and let go of the negativity in his past.  [name] [name], Ronecia E 10/16/2013, 1:21 PM
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
## 2013-10-16 NOTE — Progress Notes (Signed)
Adult Psychoeducational Group Note  Date:  10/16/2013 Time:  8:00pm Group Topic/Focus:  Wrap-Up Group:   The focus of this group is to help patients review their daily goal of treatment and discuss progress on daily workbooks.  Participation Level:  Active  Participation Quality:  Appropriate and Attentive  Affect:  Appropriate  Cognitive:  Alert and Appropriate  Insight: Appropriate  Engagement in Group:  Engaged  Modes of Intervention:  Activity  Additional Comments:  Pt was attentive and appropriate during karaoke.   Bing PlumeScott, Seanpaul Preece D 10/16/2013, 10:04 PM

## 2013-10-16 NOTE — Progress Notes (Signed)
[facility] Note  10/16/2013 5:47 PM [name] LargeCarmelo [name]  MRN:  [id number] Subjective:  " I am better"  Objective :  At this time patient is better . He reports improved mood, and presents with an improved range of affect. He denies any suicidal ideations. He states that he is less focused and less concerned about his recently broken relationship with GF, and states that upon discharge he will meet with her, along with a friend of his ( for emotional support) and officially end the relationship. On unit he has been calm, appropriate, interactive with staff and peers , going to groups. He denies medication side effects. Superficial laceration to wrist is healing well, no bleeding or sign of infection. 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   HENT: Negative.   Respiratory: Negative for cough and shortness of breath.   Cardiovascular: Negative for chest pain.  Gastrointestinal: Negative.   Skin: Negative.   Psychiatric/Behavioral: Positive for depression. Negative for suicidal ideas and hallucinations.    Blood pressure 114/77, pulse 101, temperature 98 F (36.7 C), temperature source Oral, resp. rate 16, height 5\' 6"  (1.676 m), weight 63.504 kg (140 lb), SpO2 100.00%.Body mass index is 22.61 kg/(m^2).  General Appearance: Well Groomed  Patent attorneyye Contact::  Good  Speech:  Normal Rate  Volume:  Normal  Mood:  Depressed and described as improving   Affect:  more reactive, less constricted today  Thought Process:  Linear  Orientation:  Full (Time, Place, and Person)  Thought Content:  denies  any psychotic symptoms and none are apparent- less ruminative regarding GF issue  Suicidal Thoughts:  No- contracts for safety on unit at this time  Homicidal Thoughts:  No- as above, also specifically denies any thoughts of violence towards GF  Memory:  Negative  Judgement:  Good  Insight:  Fair  Psychomotor Activity:  Normal  Concentration:  Good  Recall:  Good  Fund of Knowledge:Good  Language: Good  Akathisia:  No  Handed:  Right  AIMS (if indicated):     Assets:  Communication Skills Desire for Improvement Physical Health Social Support  Sleep:  Number of Hours: 5.5   Musculoskeletal: Strength & Muscle Tone: within normal limits Gait & Station: normal Patient leans: N/A  Current Medications: Current Facility-Administered Medications  Medication Dose Route Frequency Cailan Antonucci Last Rate Last Dose  . acetaminophen (TYLENOL) tablet 650 mg  650 mg Oral Q6H PRN Kerry HoughSpencer E Simon, PA-C      . alum & mag hydroxide-simeth (MAALOX/MYLANTA) 200-200-20 MG/5ML suspension 30 mL  30 mL Oral Q4H PRN Kerry HoughSpencer E Simon, PA-C      . citalopram (CELEXA) tablet 10 mg  10 mg Oral Daily Nehemiah MassedFernando Cobos, MD   10 mg at 10/16/13 [id number]  . hydrOXYzine (ATARAX/VISTARIL) tablet 25 mg  25 mg Oral Q6H PRN Kerry HoughSpencer E Simon, PA-C      . magnesium hydroxide (MILK OF MAGNESIA) suspension 30 mL  30 mL Oral Daily PRN Kerry HoughSpencer E Simon, PA-C      . multivitamin with minerals tablet 1 tablet  1 tablet Oral Daily Kerry HoughSpencer E Simon,  PA-C   1 tablet at 10/16/13 0749  . traZODone (DESYREL) tablet 50 mg  50 mg Oral QHS,MR X 1 Kerry HoughSpencer E Simon, PA-C   50 mg at 10/15/13 2318    Lab Results: No results found for this or any previous visit (from the past 48 hour(s)).  Physical Findings: AIMS: Facial and Oral Movements Muscles of Facial Expression: None, normal Lips and Perioral Area: None, normal Jaw: None, normal Tongue: None, normal,Extremity Movements Upper (arms, wrists, hands, fingers): None, normal Lower (legs, knees,  ankles, toes): None, normal, Trunk Movements Neck, shoulders, hips: None, normal, Overall Severity Severity of abnormal movements (highest score from questions above): None, normal Incapacitation due to abnormal movements: None, normal Patient's awareness of abnormal movements (rate only patient's report): No Awareness, Dental Status Current problems with teeth and/or dentures?: No Does patient usually wear dentures?: No  CIWA:    COWS:    Assessment: Patient improving- less depressed today, affect improving. Tolerating SSRI trial well thus far. Less focused on disrupted relationship with GF. More future oriented, and states he wants to return to work after discharge and is less concerned about work being stressful. REsponsive to support.   Treatment Plan Summary: Daily contact with patient to assess and evaluate symptoms and progress in treatment Medication management Continue CELEXA at current dose  Plan: continue inpatient treatment, support, group therapy  Medical Decision Making Problem Points:  Established problem, stable/improving (1) Data Points:  Review of medication regiment & side effects (2)  I certify that inpatient services furnished can reasonably be expected to improve the patient's condition.   COBOS, Madaline GuthrieFERNANDO 10/16/2013, 5:47 PM

## 2013-10-17 DIAGNOSIS — F3130 Bipolar disorder, current episode depressed, mild or moderate severity, unspecified: Secondary | ICD-10-CM

## 2013-10-17 MED ORDER — CITALOPRAM HYDROBROMIDE 10 MG PO TABS: 10.0000 mg | ORAL_TABLET | Freq: Every day | ORAL | Status: DC

## 2013-10-17 MED ORDER — TRAZODONE HCL 50 MG PO TABS: 50.0000 mg | ORAL_TABLET | Freq: Every evening | ORAL | Status: DC | PRN

## 2013-10-17 MED ORDER — HYDROXYZINE HCL 25 MG PO TABS: ORAL_TABLET | ORAL | Status: DC

## 2013-10-17 MED ORDER — MULTI-VITAMIN/MINERALS PO TABS: 2.0000 | ORAL_TABLET | Freq: Every day | ORAL | Status: AC

## 2013-10-17 NOTE — [facility] Group Notes (Signed)
[facility] Note   10/17/2013 10:50 AM  Participation Quality:  Attentive, engaged, answered questions when prompted  Mood/Affect:  Appropriate, slowly displaying a brighter affect  Depression Rating:  1  Anxiety Rating:  6  Thoughts of Suicide:  No  Will you contract for safety?   Yes  Current AVH:  NA  Plan for Discharge/Comments:  Patient was living with daughter and son-in-law prior to admission, will return at time of discharge.  Patient requested discharge on Tuesday as family is out of town and is worried about relapse with no one being home.  Patient is agreeable to referral [facility] and medication management.   Transportation Means:  Patient's daughter to pick up patient at time of discharge.   Supports: Son, daughter, and son-in-law, church members.   [name] [name], Sarah N

## 2013-10-17 NOTE — Progress Notes (Signed)
Patient ID: [name] [name], male   DOB: Dec 18, 1995, 18 y.o.   MRN: [id number] [facility]  10/17/2013 11:26 AM [name] [name]  MRN:  [id number] Subjective:  " I am better"  Objective :  Patient states he feels much better, he states he is  He states that he is less focused and less concerned about his recently broken relationship with GF, and states that upon discharge he will meet with her, along with a friend of his ( for emotional support) and officially end the relationship. On unit he has been calm, appropriate, interactive with staff and peers , going to groups. He denies medication side effects. Superficial laceration to wrist is healing well, no bleeding or sign of infection. 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   HENT: Negative.   Respiratory: Negative for cough and shortness of breath.   Cardiovascular: Negative for chest pain.  Gastrointestinal: Negative.   Skin: Negative.   Psychiatric/Behavioral: Positive for depression. Negative for suicidal ideas and hallucinations.    Blood pressure 112/77, pulse 105, temperature 97.5 F (36.4 C), temperature source Oral, resp. rate 18, height 5\' 6"  (1.676 m), weight 63.504 kg (140 lb), SpO2 100.00%.Body mass index is 22.61 kg/(m^2).  General Appearance: Well Groomed  Patent attorneyye Contact::  Good  Speech:  Normal Rate  Volume:  Normal  Mood:  Depressed and described as improving   Affect:  more reactive, less constricted today  Thought Process:  Linear  Orientation:  Full (Time, Place, and Person)  Thought Content:  denies any  psychotic symptoms and none are apparent- less ruminative regarding GF issue  Suicidal Thoughts:  No- contracts for safety on unit at this time  Homicidal Thoughts:  No- as above, also specifically denies any thoughts of violence towards GF  Memory:  Negative  Judgement:  Good  Insight:  Fair  Psychomotor Activity:  Normal  Concentration:  Good  Recall:  Good  Fund of Knowledge:Good  Language: Good  Akathisia:  No  Handed:  Right  AIMS (if indicated):     Assets:  Communication Skills Desire for Improvement Physical Health Social Support  Sleep:  Number of Hours: 5.5   Musculoskeletal: Strength & Muscle Tone: within normal limits Gait & Station: normal Patient leans: N/A  Current Medications: Current Facility-Administered Medications  Medication Dose Route Frequency Provider Last Rate Last Dose  . acetaminophen (TYLENOL) tablet 650 mg  650 mg Oral Q6H PRN Kerry HoughSpencer E Simon, PA-C      . alum & mag hydroxide-simeth (MAALOX/MYLANTA) 200-200-20 MG/5ML suspension 30 mL  30 mL Oral Q4H PRN Kerry HoughSpencer E Simon, PA-C      . citalopram (CELEXA) tablet 10 mg  10 mg Oral Daily Nehemiah MassedFernando Arjen Deringer, MD   10 mg at 10/17/13 [id number]  . hydrOXYzine (ATARAX/VISTARIL) tablet 25 mg  25 mg Oral Q6H PRN Kerry HoughSpencer E Simon, PA-C      . magnesium hydroxide (MILK OF MAGNESIA) suspension 30 mL  30 mL Oral Daily PRN Kerry HoughSpencer E Simon, PA-C      . multivitamin with minerals tablet 1 tablet  1 tablet Oral Daily Karleen HampshireSpencer  E Simon, PA-C   1 tablet at 10/17/13 [phone] [id number]  . traZODone (DESYREL) tablet 50 mg  50 mg Oral QHS,MR X 1 Kerry HoughSpencer E Simon, PA-C   50 mg at 10/16/13 2322    Lab Results: No results found for this or any previous visit (from the past 48 hour(s)).  Physical Findings: AIMS: Facial and Oral Movements Muscles of Facial Expression: None, normal Lips and Perioral Area: None, normal Jaw: None, normal Tongue: None, normal,Extremity Movements Upper (arms, wrists, hands, fingers): None, normal Lower (legs, knees,  ankles, toes): None, normal, Trunk Movements Neck, shoulders, hips: None, normal, Overall Severity Severity of abnormal movements (highest score from questions above): None, normal Incapacitation due to abnormal movements: None, normal Patient's awareness of abnormal movements (rate only patient's report): No Awareness, Dental Status Current problems with teeth and/or dentures?: No Does patient usually wear dentures?: No  CIWA:    COWS:    Assessment: Patient improving- less depressed today, affect improving. Tolerating SSRI trial well thus far. Less focused on disrupted relationship with GF. More future oriented, and states he wants to return to work after discharge and is less concerned about work being stressful. REsponsive to support.   Treatment Plan Summary: Daily contact with patient to assess and evaluate symptoms and progress in treatment Medication management Continue CELEXA at current dose  Plan: Discharge home soon.   Medical Decision Making Problem Points:  Established problem, stable/improving (1) Data Points:  Review of medication regiment & side effects (2)  I certify that inpatient services furnished can reasonably be expected to improve the patient's condition.   Karmela Bram 10/17/2013, 11:26 AM

## 2013-10-17 NOTE — Progress Notes (Signed)
D: pt stated that he slept well last night. Rates depression 1/10 and hopelessness 2/10. denies si/hi/avh. denies pain. Pt wrote on self inventory that when he goes home he plans to spend more time with his family. Pt is wanting to leave today. Pt has been appropriate on the unit. engaging with others.  A: scheduled medications given. Support and encouragement offered. q 15 min safety checks R: pt remains safe on unit.no signs of distress noted.

## 2013-10-17 NOTE — [facility] Group Notes (Signed)
[facility] LCSW Group Therapy  10/17/2013 2:40 PM  Type of Therapy:  Group Therapy  Participation Level:  Active  Participation Quality:  Appropriate, Attentive and Sharing  Affect:  Excited  Cognitive:  Alert, Appropriate and Oriented  Insight:  Developing/Improving  Engagement in Therapy:  Developing/Improving and Engaged  Modes of Intervention:  Discussion, Exploration, Problem-solving, Socialization and Support  Summary of Progress/Problems: Feelings around Relapse. Group members discussed the meaning of relapse and shared personal stories of relapse, how it affected them and others, and how they perceived themselves during this time. Group members were encouraged to identify triggers, warning signs and coping skills used when facing the possibility of relapse. Social supports were discussed and explored in detail.  Patient was easily engaged in group AEB frequent contributions, displayed a bright and cheerful affect.  Patient was also observed to be supportive to peers and offered advice.  He discussed feelings related to relapse, and presents with ideas on how to reduce likelihood of relapse, including maintaining a routine, creating a schedule, and reducing idle time.  Patient also receptive to increasing support system as he processed benefits of allowing other people to help him learn early warning signs that he is starting to relapse.   Pervis HockingVenning, Chivonne Rascon N 10/17/2013, 2:40 PM

## 2013-10-17 NOTE — Discharge Summary (Signed)
Physician Discharge Summary Note  Patient:  Macon LargeCarmelo House is an 18 y.o., male MRN:  [id number] DOB:  August 21, 1995 Patient phone:  [phone] (home)  Patient address:   [address] [place] [address],  Total Time spent with patient: 15 minutes  Date of Admission:  10/14/2013 Date of Discharge: 10/17/2013  Reason for Admission:  Bipolar I depressed severe  Discharge Diagnoses: Active Problems:   Bipolar 1 disorder, depressed, severe   Psychiatric Specialty Exam: Physical Exam  Psychiatric: His speech is normal and behavior is normal. Judgment and thought content normal. His mood appears not anxious. His affect is not angry. Cognition and memory are normal. He does not exhibit a depressed mood.    ROS  Blood pressure 112/77, pulse 105, temperature 97.5 F (36.4 C), temperature source Oral, resp. rate 18, height 5\' 6"  (1.676 m), weight 63.504 kg (140 lb), SpO2 100.00%.Body mass index is 22.61 kg/(m^2).  General Appearance: Negative  Eye Contact::  Good  Speech:  Clear and Coherent  Volume:  Normal  Mood:  Euthymic  Affect:  Congruent  Thought Process:  Logical  Orientation:  Full (Time, Place, and Person)  Thought Content:  WDL  Suicidal Thoughts:  No  Homicidal Thoughts:  No  Memory:  Negative  Judgement:  Good  Insight:  Good  Psychomotor Activity:  Normal  Concentration:  Good  Recall:  Good  Fund of Knowledge:Good  Language: Good  Akathisia:  NA  Handed:  Right  AIMS (if indicated):  na  Assets:  Financial Resources/Insurance Physical Health Resilience Social Support  Sleep:  Number of Hours: 5.5    Past Psychiatric History: Diagnosis:  Hospitalizations:  Outpatient Care:  Substance Abuse Care:  Self-Mutilation:  Suicidal Attempts:  Violent Behaviors:   Musculoskeletal: Strength & Muscle Tone: within normal limits Gait & Station: normal Patient leans: N/A  DSM5:  Schizophrenia Disorders:  NA Obsessive-Compulsive Disorders:   NA Trauma-Stressor Disorders:  NA Substance/Addictive Disorders:  NA Depressive Disorders:  Disruptive Mood Dysregulation Disorder (296.99)  Axis Diagnosis:   AXIS I:  Bipolar, Depressed AXIS II:  Deferred AXIS III:   Past Medical History  Diagnosis Date  . Bipolar 1 disorder    AXIS IV:  other psychosocial or environmental problems AXIS V:  41-50 serious symptoms  Level of Care:  OP  Hospital Course:  [facility] with fairly severe mood disorder after cutting lt wrist with kitchen knife at home.He has dx of Bipolar DO for which he had stopped medications for past 5-6 months.It turned out the precipitating factor in his current illness was the fact his GF wanted to end their relationship.He responded well to reinstitution of medication and Group as well as individual psychotherapy to the point he is free of suicidal thoughts/feelings and is ready to return home.  Consults:  NA  Significant Diagnostic Studies:  None  Discharge Vitals:   Blood pressure 112/77, pulse 105, temperature 97.5 F (36.4 C), temperature source Oral, resp. rate 18, height 5\' 6"  (1.676 m), weight 63.504 kg (140 lb), SpO2 100.00%. Body mass index is 22.61 kg/(m^2). Lab Results:   No results found for this or any previous visit (from the past 72 hour(s)).  Physical Findings: AIMS: Facial and Oral Movements Muscles of Facial Expression: None, normal Lips and Perioral Area: None, normal Jaw: None, normal Tongue: None, normal,Extremity Movements Upper (arms, wrists, hands, fingers): None, normal Lower (legs, knees, ankles, toes): None, normal, Trunk Movements Neck, shoulders, hips: None, normal, Overall  Severity Severity of abnormal movements (highest score from questions above): None, normal Incapacitation due to abnormal movements: None, normal Patient's awareness of abnormal movements (rate only patient's report): No Awareness, Dental Status Current problems with teeth and/or  dentures?: No Does patient usually wear dentures?: No  CIWA:    COWS:     Psychiatric Specialty Exam: See Psychiatric Specialty Exam and Suicide Risk Assessment completed by Attending Physician prior to discharge.  Discharge destination:  Other:  Home with [facility]  Is patient on multiple antipsychotic therapies at discharge:  No   Has Patient had three or more failed trials of antipsychotic monotherapy by history:  No  Recommended Plan for Multiple Antipsychotic Therapies: NA     Medication List       Indication   citalopram 10 MG tablet  Commonly known as:  CELEXA  Take 1 tablet (10 mg total) by mouth daily. For depression   Indication:  Depression     hydrOXYzine 25 MG tablet  Commonly known as:  ATARAX/VISTARIL  Take 1 tablet (25 mg) 3 times a day PRN foranxiety   Indication:  Tension, Anxiety     multivitamin with minerals tablet  Take 2 tablets by mouth daily. For low vitamin   Indication:  vitamin deficiency     traZODone 50 MG tablet  Commonly known as:  DESYREL  Take 1 tablet (50 mg total) by mouth at bedtime and may repeat dose one time if needed. For sleep   Indication:  Trouble Sleeping       Follow-up Information   Follow up with [facility]. (Attend appointment on 6/26 at 11:00am with Will Lovey NewcomerKrause. )    Contact information:   [address]  [place], [place] [address] Phone: [phone] Fax: [phone]      Follow up with [facility] On 10/23/2013. (Appointment scheduled at 11:00 am on this date with Dr. Tomasa Randunningham for medication management.  You will be charged for no show if you don't cancel or change this appointment!!)    Contact information:   [address] [place], [place] [address] Phone: [phone] Fax: [phone]      Follow-up recommendations:  Activity:  Regular exercise Diet:  Regular hear healthy Other:  [facility]  Comments:  Pt was provided with 4 day samples as  well as prescritions for his medications  Total Discharge Time:  Greater than 30 minutes.  SignedCourt Joy: Shaelin Lalley E 10/17/2013, 3:52 PM

## 2013-10-17 NOTE — Progress Notes (Signed)
Interdisciplinary Treatment Plan Update (Adult) Date: 10/17/2013  Time Reviewed: 9:45 AM  Progress in Treatment: Attending groups: Yes Participating in groups: Mixed rates of participation, is often quiet but attentive.  Taking medication as prescribed: Yes Tolerating medication: Yes Family/Significant othe contact made: Yes, CSW has spoken with patient's mother. Patient understands diagnosis: Yes Discussing patient identified problems/goals with staff: Yes Medical problems stabilized or resolved: Yes Denies suicidal/homicidal ideation: Yes Issues/concerns per patient self-inventory: Yes  New problem(s) identified: N/A  Discharge Plan or Barriers: Patient was living with parents prior to admission, will return at time of discharge.  Mother likely to pick up patient at time of discharge.  Patient has follow-up appointments for therapy and medication management and has been placed on AVS.    Reason for Continuation of Hospitalization: Anxiety Depression Medication Stabilization  Comments: N/A  Estimated length of stay: 3-5 days  For review of initial/current patient goals, please see plan of care.  Attendees: Patient:    Family:    Physician: Dr. Jama Flavorsobos 10/17/2013 8:33 AM   Nursing: ,Harrell LarkPatty D. RN 10/17/2013 8:33 AM   Clinical Social Worker: Ingredients: Loleta BooksSarah Waldine Zenz, LCSWA 10/17/2013 8:33 AM   Other:  10/17/2013 8:33 AM   Other:  10/17/2013 8:33 AM   Other:  10/17/2013 8:33 AM   Other:    Other:    Other:    Other:    Other:

## 2013-10-17 NOTE — [facility] Suicide Risk Assessment (Signed)
Demographic Factors:  Male, Adolescent or young adult and recent break up with GF  Total Time spent with patient: 30 minutes  Psychiatric Specialty Exam: Physical Exam  ROS  Blood pressure 112/77, pulse 105, temperature 97.5 F (36.4 C), temperature source Oral, resp. rate 18, height 5\' 6"  (1.676 m), weight 63.504 kg (140 lb), SpO2 100.00%.Body mass index is 22.61 kg/(m^2).  General Appearance: Well Groomed  Eye Contact::  Good  Speech:  Normal Rate  Volume:  Normal  Mood:  Euthymic and normalized mood  Affect:Patent attorney  Full Range  Thought Process:  Linear  Orientation:  Full (Time, Place, and Person)  Thought Content:  no psychotic symptoms  Suicidal Thoughts:  No- denies any thoughts of hurting himself   Homicidal Thoughts:  No- denies any thoughts of hurting anyone else , to include any thoughts of violence towards ex- GF.   Memory:  NA  Judgement:  Good  Insight:  Fair  Psychomotor Activity:  Normal  Concentration:  Good  Recall:  Good  Fund of Knowledge:Good  Language: Good  Akathisia:  No  Handed:  Right  AIMS (if indicated):     Assets:  Communication Skills Desire for Improvement Social Support Vocational/Educational  Sleep:  Number of Hours: 5.5    Musculoskeletal: Strength & Muscle Tone: within normal limits Gait & Station: normal Patient leans: N/A   Mental Status Per Nursing Assessment::   On Admission:  Self-harm thoughts  Current Mental Status by Physician: At this time patient not suicidal , not homicidal , not psychotic, future oriented, mostly focused on returning to work soon, looking forward to returning home to family.  Loss Factors: Loss of significant relationship  Historical Factors: Impulsivity  Risk Reduction Factors:   Sense of responsibility to family and Positive coping skills or problem solving skills  Continued Clinical Symptoms:  Depression:   Impulsivity  Cognitive Features That Contribute To Risk:  At this time no gross  cognitive issues noted  Suicide Risk:  Mild:  Suicidal ideation of limited frequency, intensity, duration, and specificity.  There are no identifiable plans, no associated intent, mild dysphoria and related symptoms, good self-control (both objective and subjective assessment), few other risk factors, and identifiable protective factors, including available and accessible social support.  Discharge Diagnoses:   AXIS I:  Adjustment Disorder with Depressed Mood AXIS II:  Deferred AXIS III:   Past Medical History  Diagnosis Date  . Bipolar 1 disorder    AXIS IV:  relationship break up  AXIS V:  61-70 mild symptoms  Plan Of Care/Follow-up recommendations:  Activity:  as tolerated  Diet:  regular Tests:  NA Other:  As below [facility], and [facility], where he has an appt to see Dr. Tomasa Randunningham on 6/25.  Is patient on multiple antipsychotic therapies at discharge:  No   Has Patient had three or more failed trials of antipsychotic monotherapy by history:  No  Recommended Plan for Multiple Antipsychotic Therapies: NA Patient is leaving clinic in good spirits. At his request we filled out ( Social Worker Julienne Kass/myself)  Out of work forms from his employer. Patient wants to return to work soon, after a few more days of allowing his superficial wrist wound to heal further.  We reviewed medication side effects further, and he is aware of potential for antidepressants to increase SI in young adults ( denies any SI at this time and mood seems much improved)  COBOS, FERNANDO 10/17/2013, 3:00 PM

## 2013-10-17 NOTE — Progress Notes (Signed)
D: Pt denies SI/HI/AVH. Pt is pleasant and cooperative. Pt stated he was ok, feeling much better. Pt stated that cutting his wrist was un-characteristic of him and he plans not to do it again.   A: Pt was offered support and encouragement. Pt was given scheduled medications. Pt was encourage to attend groups. Q 15 minute checks were done for safety.   R:Pt attends groups and interacts well with peers and staff. Pt is taking medication. Pt has no complaints at this time.Pt receptive to treatment and safety maintained on unit.

## 2013-10-17 NOTE — Progress Notes (Signed)
[facility] :  Will you be returning to the same living situation after discharge: Yes,  with parents At discharge, do you have transportation home?:Yes,  with parents Do you have the ability to pay for your medications:Yes,  no barriers  Release of information consent forms completed and in the chart;  Patient's signature needed at discharge.  Patient to Follow up at: Follow-up Information   [facility]. (Attend appointment on 6/26 at 11:00am with Will Lovey NewcomerKrause. )    Contact information:   [address]  [place], [place] [address] Phone: [phone] Fax: [phone]      Follow up with [facility] On 10/23/2013. (Appointment scheduled at 11:00 am on this date with Dr. Tomasa Randunningham for medication management.  You will be charged for no show if you don't cancel or change this appointment!!)    Contact information:   [address] [place], [place] [address] Phone: [phone] Fax: [phone]      Patient denies SI/HI:   Yes,  denies    Safety Planning and Suicide Prevention discussed:  Yes,  with mother  Pervis HockingVenning, Sarah N 10/17/2013, 2:43 PM

## 2013-10-18 MED ORDER — HYDROXYZINE HCL 25 MG PO TABS
25.0000 mg | ORAL_TABLET | Freq: Three times a day (TID) | ORAL | Status: DC | PRN
  Filled 2013-10-18: qty 9

## 2013-10-21 NOTE — Progress Notes (Signed)
Patient Discharge Instructions:  After Visit Summary (AVS):   Faxed to:  10/21/13 Discharge Summary Note:   Faxed to:  10/21/13 Psychiatric Admission Assessment Note:   Faxed to:  10/21/13 Suicide Risk Assessment - Discharge Assessment:   Faxed to:  10/21/13 Faxed/Sent to the Next Level Care provider:  10/21/13 Faxed to [facility] @ [phone] Faxed to [facility] @ [phone]  Jerelene ReddenSheena E [place], 10/21/2013, 2:52 PM

## 2018-02-12 ENCOUNTER — Other Ambulatory Visit: Payer: Self-pay | Admitting: Physician Assistant

## 2018-02-27 ENCOUNTER — Other Ambulatory Visit: Payer: Self-pay | Admitting: Family Medicine

## 2018-02-27 DIAGNOSIS — N492 Inflammatory disorders of scrotum: Secondary | ICD-10-CM

## 2018-03-01 ENCOUNTER — Other Ambulatory Visit: Payer: Self-pay

## 2018-03-08 ENCOUNTER — Ambulatory Visit
Admission: RE | Admit: 2018-03-08 | Discharge: 2018-03-08 | Disposition: A | Discharge status: Home / Self Care | Payer: 59 | Source: Ambulatory Visit | Attending: Family Medicine | Admitting: Family Medicine

## 2018-03-08 DIAGNOSIS — N492 Inflammatory disorders of scrotum: Secondary | ICD-10-CM

## 2018-04-05 ENCOUNTER — Ambulatory Visit: Payer: 59 | Admitting: Physician Assistant

## 2019-09-17 IMAGING — US US SCROTUM
1 series · 14 of 25 positions shown · non-contrast
Comparison: None.

CLINICAL DATA: Initial evaluation for right testicular nodule for 5
months.

EXAM:
ULTRASOUND OF SCROTUM
TECHNIQUE: Complete ultrasound examination of the testicles, epididymis, and
other scrotal structures was performed.

[Series 1: us scrotum · 0.06mm/px · 14 of 72 slices shown]
[im 1/72]
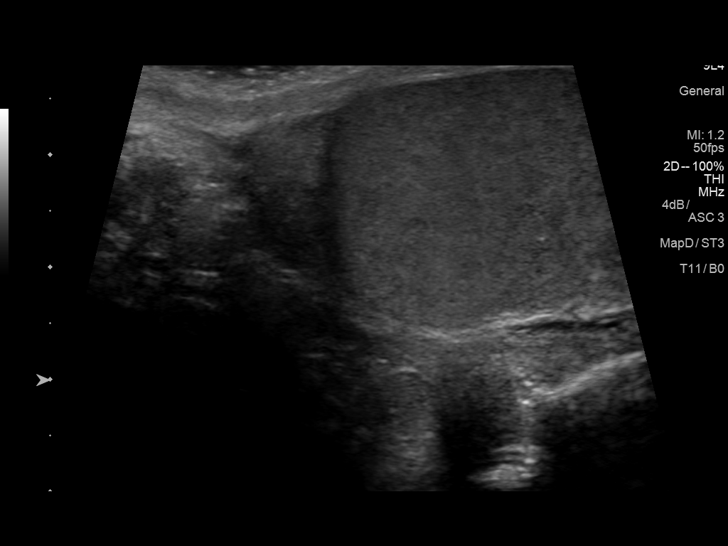
[im 6/72]
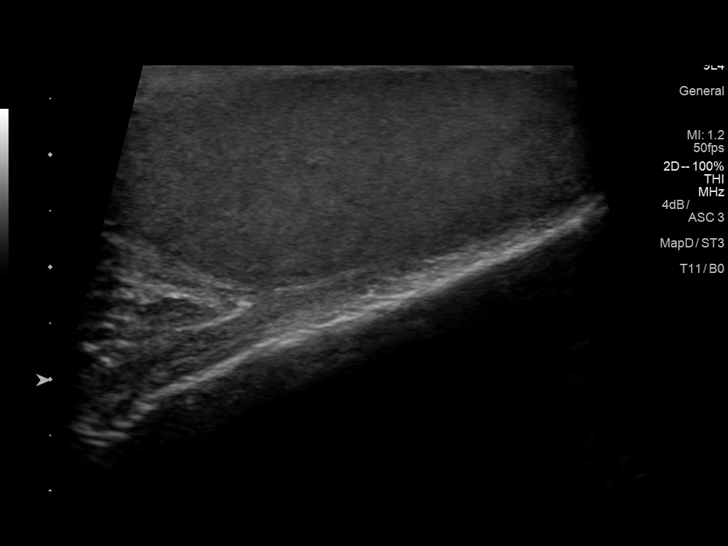
[im 12/72]
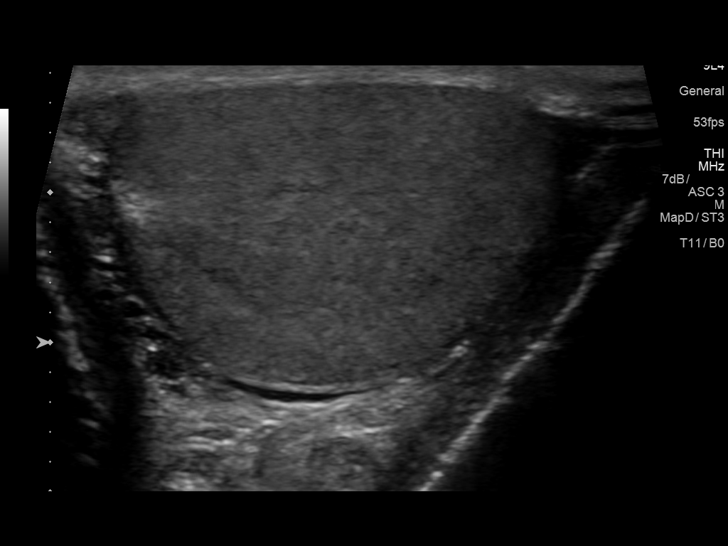
[im 18/72]
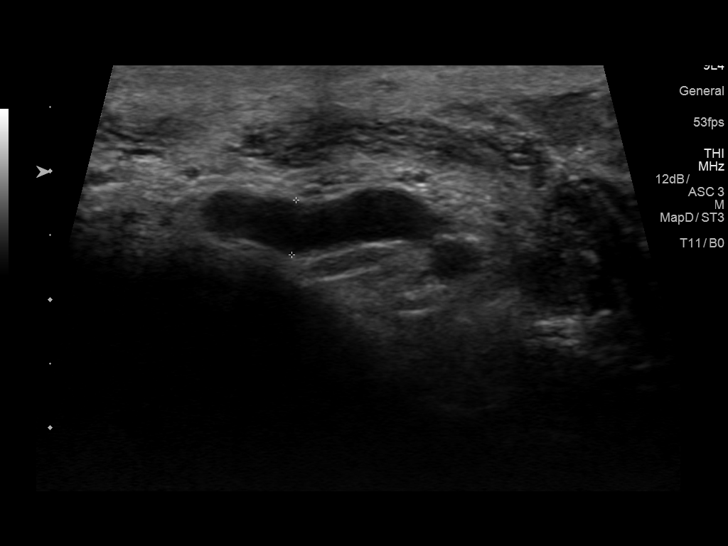
[im 24/72]
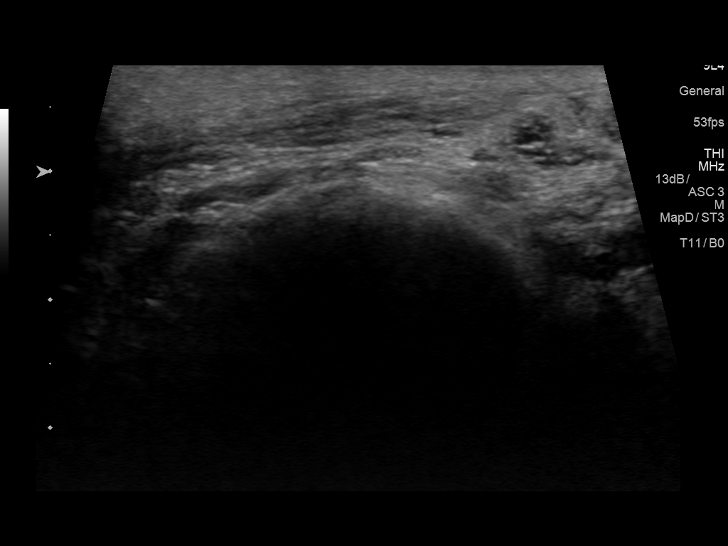
[im 27/72]
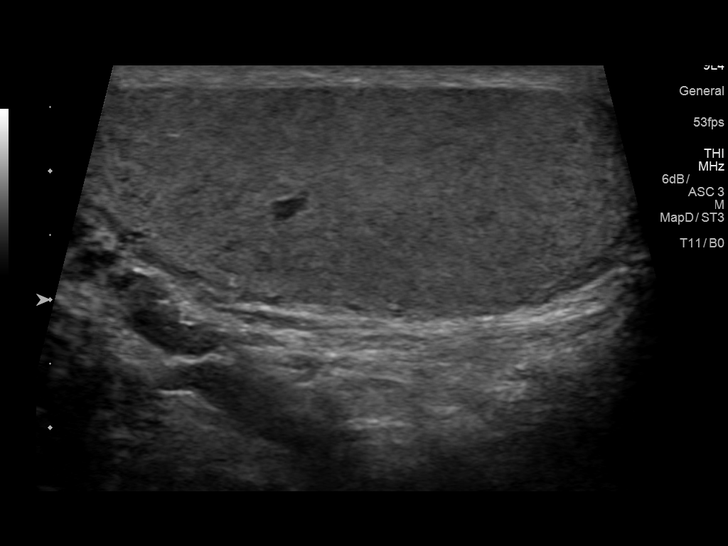
[im 33/72]
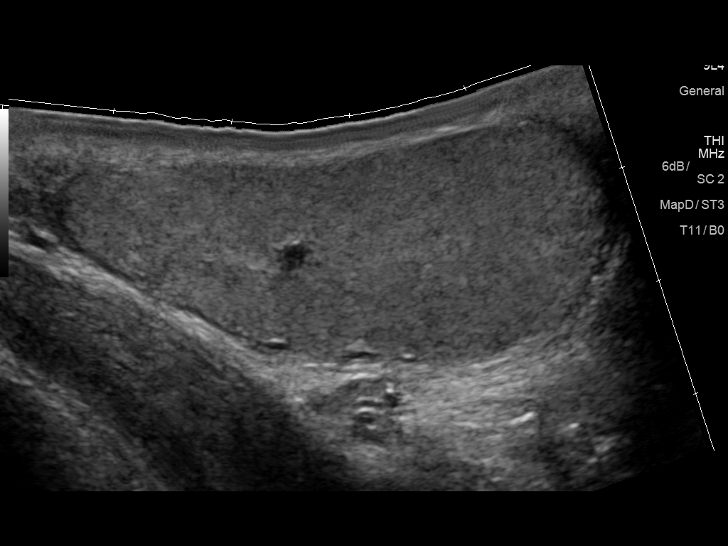
[im 39/72]
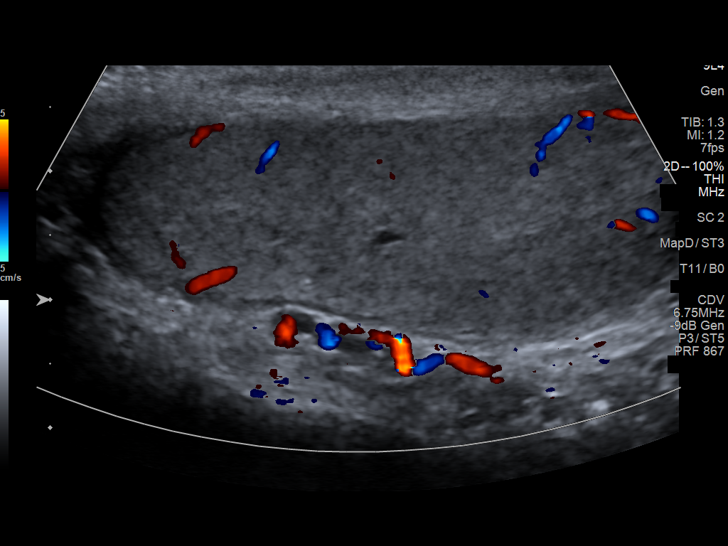
[im 45/72]
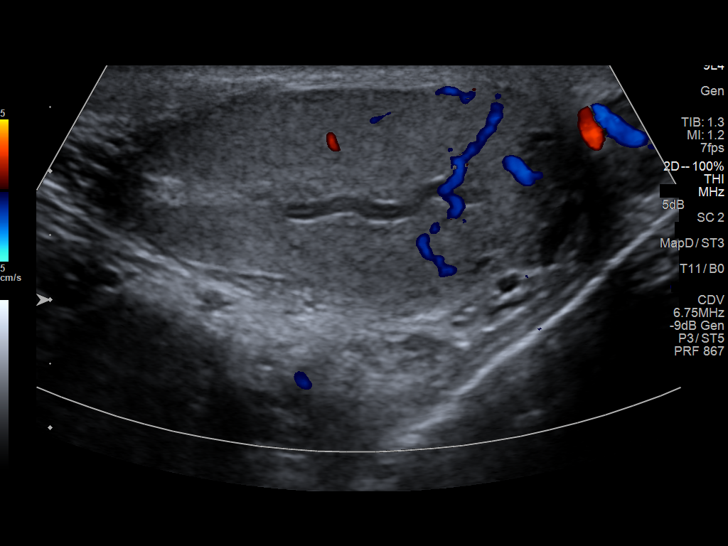
[im 48/72]
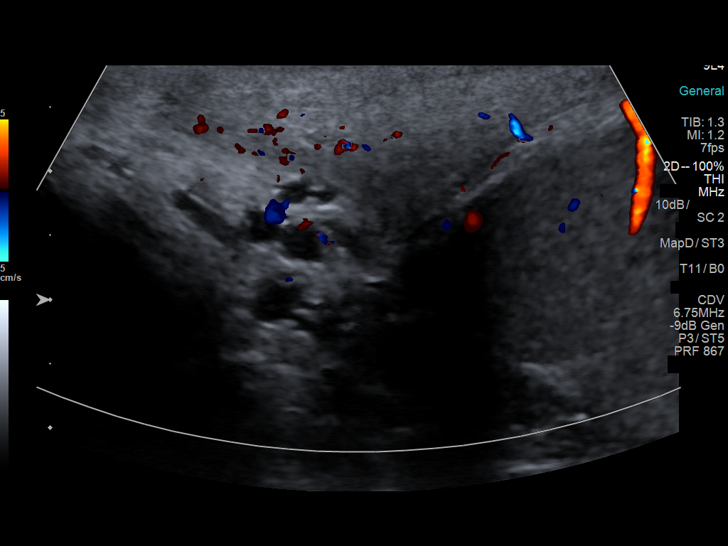
[im 54/72]
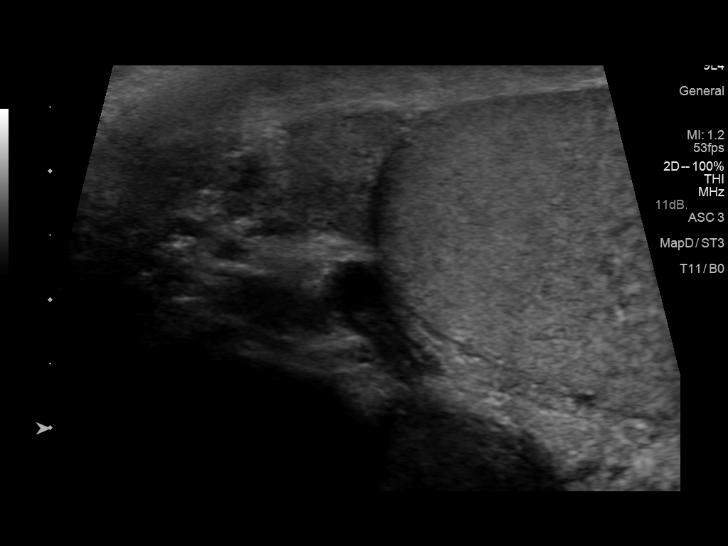
[im 60/72]
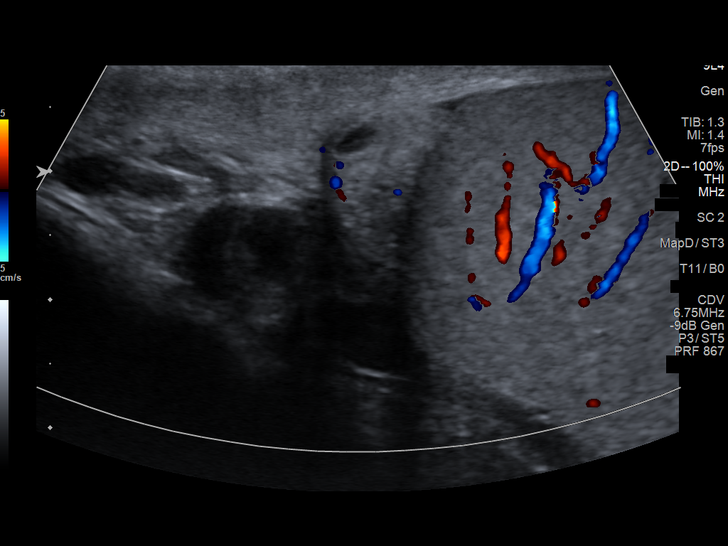
[im 66/72]
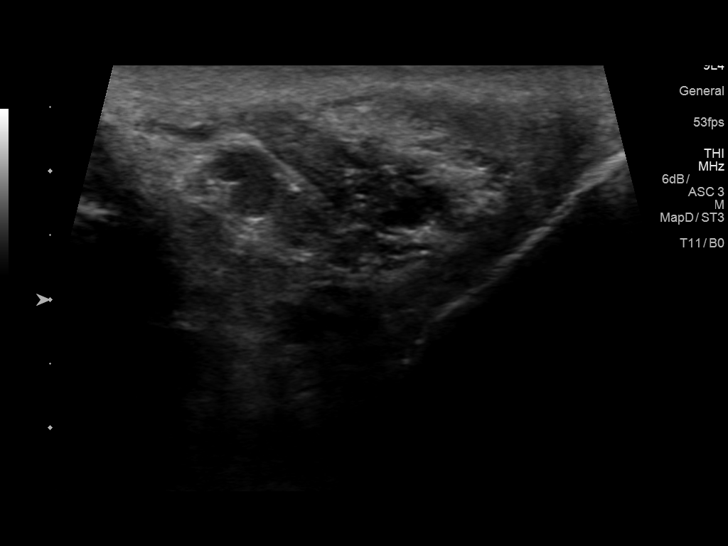
[im 72/72]
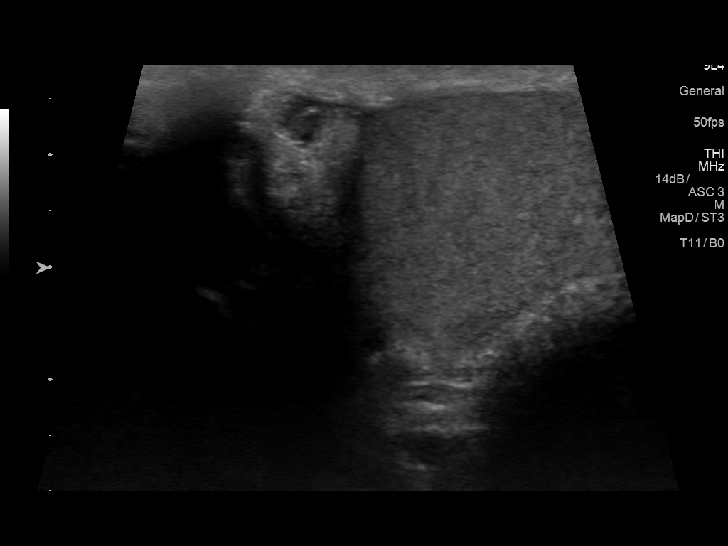

[14 of 25 positions shown; findings below may reference images not displayed]

FINDINGS: Right testicle

Measurements: 5.0 x 2.1 x 3.0 cm. No mass or microlithiasis
visualized.

Left testicle

Measurements: 4.6 x 1.8 x 2.9 cm. No mass or microlithiasis
visualized.

Right epididymis: Normal in size and appearance. 3.7 mm simple cyst
at the right epididymal head, consistent with a small epididymal
cyst versus spermatocele.

Left epididymis:  Normal in size and appearance.

Hydrocele:  None visualized.

Varicocele: None visualized. Prominent vessel positioned superior to
the right testicle noted. No associated increase in size or
vascularity with Valsalva to suggest varicocele.
IMPRESSION: 1. 3-4 mm simple cyst at the right epididymal head, most consistent
with a small benign epididymal cyst cyst/spermatocele. Finding
suspected to correspond with the palpable abnormality of concern.
2. Otherwise unremarkable and normal pelvic ultrasound. No other
acute or significant findings identified.

## 2020-07-12 ENCOUNTER — Other Ambulatory Visit: Payer: Self-pay

## 2020-07-12 ENCOUNTER — Encounter (HOSPITAL_COMMUNITY): Payer: Self-pay | Admitting: Licensed Clinical Social Worker

## 2020-07-12 ENCOUNTER — Ambulatory Visit (INDEPENDENT_AMBULATORY_CARE_PROVIDER_SITE_OTHER): Payer: No Payment, Other | Admitting: Licensed Clinical Social Worker

## 2020-07-12 DIAGNOSIS — F331 Major depressive disorder, recurrent, moderate: Secondary | ICD-10-CM | POA: Diagnosis not present

## 2020-07-12 DIAGNOSIS — F411 Generalized anxiety disorder: Secondary | ICD-10-CM
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
## 2020-07-21 ENCOUNTER — Other Ambulatory Visit: Payer: Self-pay | Admitting: Psychiatry

## 2020-07-21 ENCOUNTER — Other Ambulatory Visit: Payer: Self-pay

## 2020-07-21 ENCOUNTER — Telehealth (INDEPENDENT_AMBULATORY_CARE_PROVIDER_SITE_OTHER): Payer: No Payment, Other | Admitting: Psychiatry

## 2020-07-21 ENCOUNTER — Encounter (HOSPITAL_COMMUNITY): Payer: Self-pay | Admitting: Psychiatry

## 2020-07-21 DIAGNOSIS — F419 Anxiety disorder, unspecified: Secondary | ICD-10-CM

## 2020-07-21 DIAGNOSIS — F3181 Bipolar II disorder: Secondary | ICD-10-CM

## 2020-07-21 MED ORDER — SERTRALINE HCL 50 MG PO TABS: 50.0000 mg | ORAL_TABLET | Freq: Every day | ORAL | 1 refills | Status: DC

## 2020-07-21 MED ORDER — LAMOTRIGINE 25 MG PO TABS: ORAL_TABLET | ORAL | 1 refills | Status: DC

## 2020-07-21 MED ORDER — TRAZODONE HCL 50 MG PO TABS: 50.0000 mg | ORAL_TABLET | Freq: Every evening | ORAL | 1 refills | Status: DC | PRN

## 2020-07-21 MED FILL — SERTRALINE HCL 50 MG TABS: 50 | 30 days supply | Qty: 30 | Fill #0

## 2020-07-21 MED FILL — lamoTRIgine 25 MG TABS: 25 | 20 days supply | Qty: 60 | Fill #0

## 2020-07-21 MED FILL — traZODone HCL 50 MG TABS: 50 | 30 days supply | Qty: 30 | Fill #0

## 2020-07-21 NOTE — Progress Notes (Signed)
Psychiatric Initial Adult Assessment   Virtual Visit via Video Note  I connected with [name] [name] on 07/21/20 at  9:00 AM EDT by a video enabled telemedicine application and verified that I am speaking with the correct person using two identifiers.  Location: Patient: Home Provider: Clinic   I discussed the limitations of evaluation and management by telemedicine and the availability of in person appointments. The patient expressed understanding and agreed to proceed.  I provided 32 minutes of non-face-to-face time during this encounter. Extensive amount of time was spent in reviewing the EMR.     Patient Identification: [name] [name] MRN:  [id number] Date of Evaluation:  07/21/2020   Referral Source: Self/ Walk-in  Chief Complaint:   " My anxiety is getting unbearable."  Visit Diagnosis:    ICD-10-CM   1. Bipolar 2 disorder, major depressive episode (HCC)  F31.81 sertraline (ZOLOFT) 50 MG tablet    lamoTRIgine (LAMICTAL) 25 MG tablet    traZODone (DESYREL) 50 MG tablet  2. Anxiety  F41.9 sertraline (ZOLOFT) 50 MG tablet    History of Present Illness: This is a 25 year old male with history of bipolar disorder now seen for evaluation and establishing care.  Patient reported that since the age of 18 years he started seeing a psychiatrist and was informed that he has bipolar disorder.  He is to see Dr. Tomasa Rand with [facility] psychiatry in the past.  He stopped seeing him when he was 25 years old.  He continued to see a therapist on and off however has not been able to see therapist for the past 1 year.  He stated that he lost his insurance few months ago. He stated that his anxiety is getting daily unbearable for him and he really needs help for that.  He stated that he feels depressed most of the times.  He feels like he has no energy to do anything.  He feels irritated at times.  He has a hard time going to sleep and staying asleep.  His concentration is poor and his appetite  has also declined.  He has had passive suicidal thoughts of, " it will be easier not to live."  But denied having any active suicidal ideations or intent.  He stated that he has no intention to hurt himself because he knows his family needs him. He also reported having periods of time when he has a lot of energy with elevated mood.  He sometimes makes impulsive decisions and he spent money on things that he does not need.  He also reported being very irritable during those phases. He also reported having decreased need for sleep at times.  He denied any hallucinations or delusions.  He denied any PTSD symptoms.  He denied misusing any illicit substances or excessively consuming alcohol.  However he did acknowledge that he had misused previously prescribed Ambien in the past.  He did not realize how addictive it could be.  He stated that the last meds he was prescribed by his psychiatrist were; clinical and he does not really think he wants to go back on them.  He stated that Seroquel made him feel numb and he also noted a weight gain of about 60 pounds.  He has taken Celexa in the past. He was agreeable to trial of sertraline to target his depressive symptoms and anxiety symptoms.  He was asked if he would like to retry Lamictal for mood stabilization and he agreed.  He was also agreeable to trying trazodone to help  with sleep.     Past Psychiatric History: One past psychiatric hospitalization at [facility] in 2015 following suicidal ideations. Has seen Dr. Tomasa Rand with [facility] in the past.  Previous Psychotropic Medications: Yes - Celexa, Seroquel, Lamictal, Hydroxyzine, Trazodone, Ambien. Has hx of misusing Ambien in the past.  Substance Abuse History in the last 12 months:  No.  Consequences of Substance Abuse: NA  Past Medical History:  Past Medical History:  Diagnosis Date   Bipolar 1 disorder (HCC)    History reviewed. No pertinent surgical history.  Family  Psychiatric History: denied  Family History: History reviewed. No pertinent family history.  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    Sexual activity: Yes    Partners: Female    Birth control/protection: None  Other Topics Concern   Not on file  Social History Narrative   Not on file   Social Determinants of Health   Financial Resource Strain: High Risk   Difficulty of Paying Living Expenses: Very hard  Food Insecurity: No Food Insecurity   Worried About Programme researcher, broadcasting/film/video in the Last Year: Never true   Ran Out of Food in the Last Year: Never true  Transportation Needs: No Transportation Needs   Lack of Transportation (Medical): No   Lack of Transportation (Non-Medical): No  Physical Activity: Inactive   Days of Exercise per Week: 0 days   Minutes of Exercise per Session: 0 min  Stress: Stress Concern Present   Feeling of Stress : Very much  Social Connections: Moderately Isolated   Frequency of Communication with Friends and Family: Three times a week   Frequency of Social Gatherings with Friends and Family: Three times a week   Attends Religious Services: Never   Active Member of Clubs or Organizations: No   Attends Banker Meetings: Never   Marital Status: Living with partner    Additional Social History: Currently unemployed, lives with his girlfriend.  Grew up in a very religious household.  Family is supportive.  Left previous job because of a toxic work  environment.  Allergies:  No Known Allergies  Metabolic Disorder Labs: No results found for: HGBA1C, MPG No results found for: PROLACTIN No results found for: CHOL, TRIG, HDL, CHOLHDL, VLDL, LDLCALC No results found for: TSH  Therapeutic Level Labs: No results found for: LITHIUM No results found for: CBMZ No results found for: VALPROATE  Current Medications: Current Outpatient Medications  Medication Sig Dispense Refill   lamoTRIgine (LAMICTAL) 25 MG tablet Take 1 tablet daily for 2 weeks then take 2 tablets daily for 2 weeks then take 3 tablets daily 60 tablet 1   sertraline (ZOLOFT) 50 MG tablet Take 1 tablet (50 mg total) by mouth daily with breakfast. 30 tablet 1   traZODone (DESYREL) 50 MG tablet Take 1 tablet (50 mg total) by mouth at bedtime as needed for sleep. 30  tablet 1   Multiple Vitamins-Minerals (MULTIVITAMIN WITH MINERALS) tablet Take 2 tablets by mouth daily. For low vitamin     No current facility-administered medications for this visit.      Psychiatric Specialty Exam: Review of Systems  There were no vitals taken for this visit.There is no height or weight on file to calculate BMI.  General Appearance: Fairly Groomed  Eye Contact:  Good  Speech:  Clear and Coherent and Normal Rate  Volume:  Normal  Mood:  Anxious and Depressed  Affect:  Congruent  Thought Process:  Goal Directed and Descriptions of Associations: Intact  Orientation:  Full (Time, Place, and Person)  Thought Content:  Logical  Suicidal Thoughts:  No  Homicidal Thoughts:  No  Memory:  Immediate;   Good Recent;   Good  Judgement:  Fair  Insight:  Fair  Psychomotor Activity:  Normal  Concentration:  Concentration: Good and Attention Span: Good  Recall:  Good  Fund of Knowledge:Good  Language: Good  Akathisia:  Negative  Handed:  Right  AIMS (if indicated): Not indicated  Assets:  Communication Skills Desire for Improvement Financial Resources/Insurance Housing Social  Support Transportation  ADL's:  Intact  Cognition: WNL  Sleep:  Poor   Screenings: AUDIT   Flowsheet Row [facility]  Alcohol Use Disorder Identification Test Final Score (AUDIT) 0    GAD-7   Flowsheet Row Counselor from 07/12/2020 in [facility]  Total GAD-7 Score 18    PHQ2-9   Flowsheet Row Counselor [facility] 07/12/2020 in [facility]  PHQ-2 Total Score 6  PHQ-9 Total Score 23    Flowsheet Row Counselor [facility] 07/12/2020 in [facility]  C-SSRS RISK CATEGORY Low Risk      Assessment and Plan: Based on patient's history and evaluation, he meets criteria for bipolar 2 disorder, with current major depressive episode.  Patient has responded well to medical in the past and is agreeable to retry it again.  Patient was recommended trial of sertraline to target his anxiety and depression symptoms.  Patient was also recommended trial of trazodone which she has taken in the past to help with sleep. Potential side effects of medication and risks vs benefits of treatment vs non-treatment were explained and discussed. All questions were answered. He plans to continue to see Mr. Adam for therapy services.   1. Bipolar 2 disorder, major depressive episode (HCC)  - Start sertraline (ZOLOFT) 50 MG tablet; Take 1 tablet (50 mg total) by mouth daily with breakfast.  Dispense: 30 tablet; Refill: 1 - Restart lamoTRIgine (LAMICTAL) 25 MG tablet; Take 1 tablet daily for 2 weeks then take 2 tablets daily for 2 weeks then take 3 tablets daily  Dispense: 60 tablet; Refill: 1 -  Restart traZODone (DESYREL) 50 MG tablet; Take 1 tablet (50 mg total) by mouth at bedtime as needed for sleep.  Dispense: 30 tablet; Refill: 1  2. Anxiety  - sertraline (ZOLOFT) 50 MG tablet; Take 1 tablet (50 mg total) by mouth daily with breakfast.  Dispense: 30 tablet; Refill:  1  Continue individual therapy with Mr. Madelaine Bhat. Follow-up in 6 weeks.  Zena Amos, MD 3/23/20229:16 AM

## 2020-08-19 ENCOUNTER — Ambulatory Visit (INDEPENDENT_AMBULATORY_CARE_PROVIDER_SITE_OTHER): Payer: No Payment, Other | Admitting: Licensed Clinical Social Worker

## 2020-08-19 ENCOUNTER — Other Ambulatory Visit: Payer: Self-pay

## 2020-08-19 DIAGNOSIS — F411 Generalized anxiety disorder: Secondary | ICD-10-CM | POA: Diagnosis not present

## 2020-08-19 DIAGNOSIS — F3181 Bipolar II disorder: Secondary | ICD-10-CM
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
## 2020-08-20 ENCOUNTER — Other Ambulatory Visit: Payer: Self-pay

## 2020-08-20 MED FILL — Sertraline HCl Tab 50 MG: ORAL | 30 days supply | Qty: 30 | Fill #0 | Status: AC

## 2020-08-20 MED FILL — Lamotrigine Tab 25 MG: ORAL | 30 days supply | Qty: 60 | Fill #0 | Status: AC

## 2020-08-31 ENCOUNTER — Other Ambulatory Visit: Payer: Self-pay

## 2020-08-31 ENCOUNTER — Telehealth (HOSPITAL_COMMUNITY): Payer: Self-pay | Admitting: *Deleted

## 2020-08-31 DIAGNOSIS — F3181 Bipolar II disorder: Secondary | ICD-10-CM

## 2020-08-31 MED ORDER — LAMOTRIGINE 25 MG PO TABS
ORAL_TABLET | ORAL | 0 refills | Status: DC
  Filled 2020-08-31: qty 90, fill #0

## 2020-08-31 NOTE — Telephone Encounter (Signed)
VM left on writers phone stating he doesn't have enough of his Lamictal. Reviewed the record and his dose was gradually increased to 3 pills a day but rx written for 60 pill rather than 90. It was last written on 07/21/20. He should be out. He has an appt with Dr Evelene Croon on 09/10/20 but will need meds before then. Will reach out to provider for meds.

## 2020-08-31 NOTE — Addendum Note (Signed)
Addended by: Zena Amos on: 08/31/2020 01:24 PM   Modules accepted: Orders

## 2020-08-31 NOTE — Telephone Encounter (Signed)
New Rx sent.

## 2020-09-02 ENCOUNTER — Ambulatory Visit (INDEPENDENT_AMBULATORY_CARE_PROVIDER_SITE_OTHER): Payer: No Payment, Other | Admitting: Licensed Clinical Social Worker

## 2020-09-02 ENCOUNTER — Other Ambulatory Visit: Payer: Self-pay

## 2020-09-02 DIAGNOSIS — F314 Bipolar disorder, current episode depressed, severe, without psychotic features: Secondary | ICD-10-CM

## 2020-09-02 DIAGNOSIS — F411 Generalized anxiety disorder: Secondary | ICD-10-CM

## 2020-09-02 NOTE — Progress Notes (Signed)
   THERAPIST PROGRESS NOTE  Session Time: 35  Participation Level: Active  Behavioral Response: CasualAlertAnxious and Depressed  Type of Therapy: Individual Therapy  Treatment Goals addressed: Diagnosis: Bipolar depression and GAD   Interventions: CBT and Supportive  Summary: [name] [name] is a 25 y.o. male who presents with Bipolar depression and GAD.   Suicidal/Homicidal: NAwithout intent/plan  Virtual Visit via Video Note  I connected with [name] [name] on 09/02/20 at  2:00 PM EDT by a video enabled telemedicine application and verified that I am speaking with the correct person using two identifiers.  Location: Patient: [name] [name]  Provider: [name]    I discussed the limitations of evaluation and management by telemedicine and the availability of in person appointments. The patient expressed understanding and agreed to proceed.  Therapist Response:    Subjective/Objective:  Pt was alert and oriented x 5. He was dressed casually and engaged well in therapy session. Pt presented with anxious and depressed mood/affect. He was cooperative and maintained good eye contact.    Pt presented to session today late. 2 links were sent to pt phone at 2:05 and 2:09 with no response. LCSW f/u with PC at 2:12 and pt states that he got the dates wrong and joined session at 2:16. LCSW explained that being on time and the importance behind it. [name] [name] was agreeable.   Pt primary stressor is family conflict. He states that his "white" grandmother states his hair looked like [name] [name] and that if he grows dreadlocks they will smell. Tip felt the comments were racist and he states that increased his depressed.  [name] [name] reports that his anxiety has been heightened due to social and crowd anxiety. Provided the example of going to a food truck event with over 5000 people. He states that he shut down due to being overwhelmed, tense, and restless.    Assessment/Plan:  Pt  endorses symptoms for anxiety and depression for fatigue, tension, worry, hopelessness. He does meet criteria from previous documentation for bipolar 2 depression and GAD. He is taking his medication as prescribed. Objective/plan will be to increase social interaction in minor doses. Such getting dinner with friends at [employer]     I discussed the assessment and treatment plan with the patient. The patient was provided an opportunity to ask questions and all were answered. The patient agreed with the plan and demonstrated an understanding of the instructions.   The patient was advised to call back or seek an in-person evaluation if the symptoms worsen or if the condition fails to improve as anticipated.  I provided 45 minutes of non-face-to-face time during this encounter.   [name], LCSW   Plan: Return again in 4 weeks.     [name], LCSW 09/02/2020

## 2020-09-10 ENCOUNTER — Encounter (HOSPITAL_COMMUNITY): Payer: Self-pay | Admitting: Psychiatry

## 2020-09-10 ENCOUNTER — Other Ambulatory Visit: Payer: Self-pay

## 2020-09-10 ENCOUNTER — Telehealth (INDEPENDENT_AMBULATORY_CARE_PROVIDER_SITE_OTHER): Payer: No Payment, Other | Admitting: Psychiatry

## 2020-09-10 DIAGNOSIS — F3181 Bipolar II disorder: Secondary | ICD-10-CM | POA: Diagnosis not present

## 2020-09-10 MED ORDER — SERTRALINE HCL 100 MG PO TABS
100.0000 mg | ORAL_TABLET | Freq: Every day | ORAL | 1 refills | Status: DC
  Filled 2020-09-10: qty 30, 30d supply, fill #0
  Filled 2020-10-18: qty 30, 30d supply, fill #1

## 2020-09-10 MED ORDER — TRAZODONE HCL 50 MG PO TABS
50.0000 mg | ORAL_TABLET | Freq: Every evening | ORAL | 1 refills | Status: DC | PRN
  Filled 2020-09-10: qty 30, 30d supply, fill #0

## 2020-09-10 MED ORDER — LAMOTRIGINE 100 MG PO TABS
100.0000 mg | ORAL_TABLET | Freq: Every day | ORAL | 1 refills | Status: DC
  Filled 2020-09-10: qty 30, 30d supply, fill #0
  Filled 2020-10-18: qty 30, 30d supply, fill #1

## 2020-09-10 NOTE — Progress Notes (Signed)
BH OP Progress Note   Virtual Visit via Video Note  I connected with [name] [name] on 09/10/20 at 10:00 AM EDT by a video enabled telemedicine application and verified that I am speaking with the correct person using two identifiers.  Location: Patient: Home Provider: Clinic   I discussed the limitations of evaluation and management by telemedicine and the availability of in person appointments. The patient expressed understanding and agreed to proceed.  I provided 18 minutes of non-face-to-face time during this encounter.       Patient Identification: [name] [name] MRN:  [id number] Date of Evaluation:  09/10/2020    Chief Complaint:   " I am doing better but not completely there yet."  Visit Diagnosis:    ICD-10-CM   1. Bipolar 2 disorder, major depressive episode (HCC)  F31.81 traZODone (DESYREL) 50 MG tablet    sertraline (ZOLOFT) 100 MG tablet    lamoTRIgine (LAMICTAL) 100 MG tablet    History of Present Illness: Patient seen for follow-up today.  He was last seen about 6 weeks ago at that time he was restarted on Lamictal and was also prescribed Zoloft along with trazodone at bedtime for sleep. Today patient reported that he is feeling better but not completely. He stated that Zoloft has helped his mood and anxiety but "its more like lifting 100 pound weight when I need another 900 pounds to be lifted."  He stated that he is not sure that it was doing much for him. He stated that he only takes a quarter of trazodone which has helped him fall asleep and stay asleep however sometimes he has a hard time waking up in the morning that he is getting used to that. He stated that his mood is not all over the place anymore and his anxiety is better. He denied having any suicidal ideations in the past few weeks.  He denied any hallucinations or delusions. Writer asked him if he would like for his medications to be adjusted so that he can get optimal results.  Patient was agreeable  to this recommendation.  Writer informed that we will increase his dose of sertraline 200 mg and also adjust the dose of Lamictal 100 mg.  He has been taking 3 tablets of 25 mg strength Lamictal for the past 2 plus weeks now.  Patient verbalized his understanding.  He stated that he still pretty much at [facility] and has not really actively been looking for a job.  He knows he needs to start looking.  He has been seeing therapist [name] [name] on a regular basis.  Past Psychiatric History: One past psychiatric hospitalization at [facility] in 2015 following suicidal ideations. Has seen Dr. Tomasa [name] with [facility] in the past.  Previous Psychotropic Medications: Yes - Celexa, Seroquel, Lamictal, Hydroxyzine, Trazodone, Ambien. Has hx of misusing Ambien in the past.  Substance Abuse History in the last 12 months:  No.  Consequences of Substance Abuse: NA  Past Medical History:  Past Medical History:  Diagnosis Date  . Bipolar 1 disorder (HCC)    History reviewed. No pertinent surgical history.  Family Psychiatric History: denied  Family History: History reviewed. No pertinent family history.  Social History:   Social History   Socioeconomic History  . Marital status: Single    Spouse name: Not on file  . Number of children: Not on file  . Years of education: Not on file  . Highest education level: Not on file  Occupational History  . Not on  file  Tobacco Use  . Smoking status: Never Smoker  . Smokeless tobacco: Never Used  Substance and Sexual Activity  . Alcohol use: Yes    Comment: monthly to bi monthly   . 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 Sexual activity: Yes    Partners: Female    Birth control/protection: None  Other Topics Concern  . Not on file  Social History Narrative  . Not on file   Social Determinants of Health   Financial Resource Strain: High Risk  . Difficulty of Paying Living Expenses: Very hard   Food Insecurity: No Food Insecurity  . Worried About Programme researcher, broadcasting/film/video in the Last Year: Never true  . Ran Out of Food in the Last Year: Never true  Transportation Needs: No Transportation Needs  . Lack of Transportation (Medical): No  . Lack of Transportation (Non-Medical): No  Physical Activity: Inactive  . Days of Exercise per Week: 0 days  . Minutes of Exercise per Session: 0 min  Stress: Stress Concern Present  . Feeling of Stress : Very much  Social Connections: Moderately Isolated  . Frequency of Communication with Friends and Family: Three times a week  . Frequency of Social Gatherings with Friends and Family: Three times a week  . Attends Religious Services: Never  . Active Member of Clubs or Organizations: No  . Attends Banker Meetings: Never  . Marital Status: Living with partner    Additional Social History: Currently unemployed, lives with his girlfriend.  Grew up in a very religious household.  Family is supportive.  Left previous job because of a toxic work environment.  Allergies:  No Known Allergies  Metabolic Disorder Labs: No results found for: HGBA1C, MPG No results found for: PROLACTIN No results found for: CHOL, TRIG, HDL, CHOLHDL, VLDL, LDLCALC No results found for: TSH  Therapeutic Level Labs: No results found for: LITHIUM No results found for: CBMZ No results found for: VALPROATE  Current Medications: Current Outpatient Medications  Medication Sig Dispense Refill  . lamoTRIgine (LAMICTAL) 100 MG tablet Take 1 tablet (100 mg total) by mouth daily. 30 tablet 1  . sertraline (ZOLOFT) 100 MG tablet Take 1 tablet (100 mg total) by mouth daily. 30 tablet 1  . Multiple Vitamins-Minerals (MULTIVITAMIN WITH MINERALS) tablet Take 2 tablets by mouth daily. For low vitamin    . traZODone (DESYREL) 50 MG tablet Take 1 tablet (50 mg total) by mouth at bedtime as needed for sleep. 30 tablet 1  . traZODone (DESYREL) 50 MG tablet TAKE 1 TABLET (50  MG TOTAL) BY MOUTH AT BEDTIME AS NEEDED FOR SLEEP. 30 tablet 1   No current facility-administered medications for this visit.      Psychiatric Specialty Exam: Review of Systems  There were no vitals taken for this visit.There is no height or weight on file to calculate BMI.  General Appearance: Fairly Groomed  Eye Contact:  Good  Speech:  Clear and Coherent and Normal Rate  Volume:  Normal  Mood:  Euthymic  Affect:  Congruent  Thought Process:  Goal Directed and Descriptions of Associations: Intact  Orientation:  Full (Time, Place, and Person)  Thought Content:  Logical  Suicidal Thoughts:  No  Homicidal Thoughts:  No  Memory:  Immediate;   Good Recent;   Good  Judgement:  Fair  Insight:  Fair  Psychomotor Activity:  Normal  Concentration:  Concentration: Good and Attention Span: Good  Recall:  Good  Fund of Knowledge:Good  Language: Good  Akathisia:  Negative  Handed:  Right  AIMS (if indicated): Not indicated  Assets:  Communication Skills Desire for Improvement Financial Resources/Insurance Housing Social Support Transportation  ADL's:  Intact  Cognition: WNL  Sleep:  Good   Screenings: AUDIT   Flowsheet Row [facility]  Alcohol Use Disorder Identification Test Final Score (AUDIT) 0    GAD-7   Flowsheet Row Counselor from 07/12/2020 in [facility]  Total GAD-7 Score 18    PHQ2-9   Flowsheet Row Video Visit from 09/10/2020 in [facility] Counselor from 07/12/2020 in [facility]  PHQ-2 Total Score 1 6  PHQ-9 Total Score 3 23    Flowsheet Row Video Visit from 09/10/2020 in [facility] Counselor from 07/12/2020 in [facility]  C-SSRS RISK CATEGORY Error: Q3, 4, or 5 should not be populated when Q2 is No Low Risk      Assessment and Plan: Patient  reported some improvement in his anxiety and depression symptoms after being started on Zoloft and Lamictal along with trazodone for sleep.  His sleep is improved remarkably although he complained of having some difficulty in waking up in the morning.  He was agreeable to adjusting the doses of Zoloft and Lamictal so that he can get better results.  1. Bipolar 2 disorder, major depressive episode (HCC)  - traZODone (DESYREL) 50 MG tablet; Take 1 tablet (50 mg total) by mouth at bedtime as needed for sleep.  Dispense: 30 tablet; Refill: 1 -Increase sertraline (ZOLOFT) 100 MG tablet; Take 1 tablet (100 mg total) by mouth daily.  Dispense: 30 tablet; Refill: 1 -Increase lamoTRIgine (LAMICTAL) 100 MG tablet; Take 1 tablet (100 mg total) by mouth daily.  Dispense: 30 tablet; Refill: 1  Continue individual therapy with Mr. Madelaine Bhat. Follow-up in 2 months.  Patient was informed that his care is being transferred to a different provider in the clinic.  Zena Amos, MD 5/13/202210:15 AM

## 2020-09-14 ENCOUNTER — Other Ambulatory Visit: Payer: Self-pay

## 2020-09-16 ENCOUNTER — Ambulatory Visit (INDEPENDENT_AMBULATORY_CARE_PROVIDER_SITE_OTHER): Payer: No Payment, Other | Admitting: Licensed Clinical Social Worker

## 2020-09-16 ENCOUNTER — Other Ambulatory Visit: Payer: Self-pay

## 2020-09-16 DIAGNOSIS — F411 Generalized anxiety disorder: Secondary | ICD-10-CM

## 2020-09-16 DIAGNOSIS — F3181 Bipolar II disorder: Secondary | ICD-10-CM | POA: Diagnosis not present
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
## 2020-10-07 ENCOUNTER — Other Ambulatory Visit: Payer: Self-pay

## 2020-10-07 ENCOUNTER — Ambulatory Visit (INDEPENDENT_AMBULATORY_CARE_PROVIDER_SITE_OTHER): Payer: No Payment, Other | Admitting: Licensed Clinical Social Worker

## 2020-10-07 DIAGNOSIS — F3181 Bipolar II disorder: Secondary | ICD-10-CM | POA: Diagnosis not present

## 2020-10-07 DIAGNOSIS — F411 Generalized anxiety disorder: Secondary | ICD-10-CM
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
## 2020-10-18 ENCOUNTER — Other Ambulatory Visit: Payer: Self-pay

## 2020-10-28 ENCOUNTER — Ambulatory Visit (INDEPENDENT_AMBULATORY_CARE_PROVIDER_SITE_OTHER): Payer: No Payment, Other | Admitting: Licensed Clinical Social Worker

## 2020-10-28 ENCOUNTER — Other Ambulatory Visit: Payer: Self-pay

## 2020-10-28 DIAGNOSIS — F3181 Bipolar II disorder: Secondary | ICD-10-CM

## 2020-10-28 DIAGNOSIS — F411 Generalized anxiety disorder: Secondary | ICD-10-CM

## 2020-10-28 NOTE — Progress Notes (Signed)
   THERAPIST PROGRESS NOTE  Session Time: 35   Virtual Visit via Video Note  I connected with [name] on 10/28/20 at  3:00 PM EDT by a video enabled telemedicine application and verified that I am speaking with the correct person using two identifiers.  Location: Patient: [facility]  Provider: [facility]    I discussed the limitations of evaluation and management by telemedicine and the availability of in person appointments. The patient expressed understanding and agreed to proceed.      I discussed the assessment and treatment plan with the patient. The patient was provided an opportunity to ask questions and all were answered. The patient agreed with the plan and demonstrated an understanding of the instructions.   The patient was advised to call back or seek an in-person evaluation if the symptoms worsen or if the condition fails to improve as anticipated.  I provided 45 minutes of non-face-to-face time during this encounter.   [name], LCSW   Participation Level: Active  Behavioral Response: CasualAlertAnxious and Depressed  Type of Therapy: Individual Therapy  Treatment Goals addressed: Diagnosis: MDD and GAD  Interventions: CBT and Supportive  Summary: Chane Cowden is a 25 y.o. male who presents with a anxious mood/affect. He was cooperative and maintained good eye contact. [place] was alert and oriented x 5. Primary stressor for is housing. Japhet reports that he is struggling with his relationship with his roommate. Pt reports that he is manipulative and rarely does things around the house. Tramell and the other roommates have tried to talk to him about, but do not seem to get through. Bassam reports that he is at the point where he does not really want a relationship with this person he just wants out of the situation. Aquarius reports the lease they all have signed is good for another 4 months.  Suicidal/Homicidal: NAwithout  intent/plan  Therapist Response:    Intervention/Plan: LCSW used supportive and CBT as primary intervention. Partializing was the main point in today's session. Breaking things down into more manageable pieces. Plan for Lyfe will be to write down leading causes or factors of his anxiety. Pt then to prioritize the list of anxiety highest to lower. The goal is to attack one thing at a time instead of looking at them as whole. Vishnu was agreeable to that plan.  Plan: Return again in 4  weeks.      [name], LCSW 10/28/2020

## 2020-11-09 ENCOUNTER — Encounter (HOSPITAL_COMMUNITY): Payer: Self-pay | Admitting: Physician Assistant

## 2020-11-09 ENCOUNTER — Other Ambulatory Visit: Payer: Self-pay

## 2020-11-09 ENCOUNTER — Telehealth (INDEPENDENT_AMBULATORY_CARE_PROVIDER_SITE_OTHER): Payer: No Payment, Other | Admitting: Physician Assistant

## 2020-11-09 DIAGNOSIS — F3181 Bipolar II disorder: Secondary | ICD-10-CM | POA: Diagnosis not present

## 2020-11-09 MED ORDER — LAMOTRIGINE 25 MG PO TABS
25.0000 mg | ORAL_TABLET | Freq: Every day | ORAL | 0 refills | Status: AC
  Filled 2020-11-09: qty 7, 7d supply, fill #0

## 2020-11-09 MED ORDER — ARIPIPRAZOLE 10 MG PO TABS
10.0000 mg | ORAL_TABLET | Freq: Every day | ORAL | 1 refills | Status: AC
  Filled 2020-11-09: qty 30, 30d supply, fill #0
  Filled 2020-12-29: qty 30, 30d supply, fill #1

## 2020-11-09 MED ORDER — TRAZODONE HCL 50 MG PO TABS
50.0000 mg | ORAL_TABLET | Freq: Every evening | ORAL | 1 refills | Status: DC | PRN
  Filled 2020-11-09: qty 30, 30d supply, fill #0

## 2020-11-09 MED ORDER — SERTRALINE HCL 50 MG PO TABS
150.0000 mg | ORAL_TABLET | Freq: Every day | ORAL | 1 refills | Status: DC
  Filled 2020-11-09: qty 90, 30d supply, fill #0
  Filled 2020-12-25: qty 90, 30d supply, fill #1

## 2020-11-09 NOTE — Progress Notes (Signed)
BH MD/PA/NP OP Progress Note  Virtual Visit via Telephone Note  I connected with [name] [name] on 11/09/20 at  3:00 PM EDT by telephone and verified that I am speaking with the correct person using two identifiers.  Location: Patient: Home Provider: Clinic   I discussed the limitations, risks, security and privacy concerns of performing an evaluation and management service by telephone and the availability of in person appointments. I also discussed with the patient that there may be a patient responsible charge related to this service. The patient expressed understanding and agreed to proceed.  Follow Up Instructions:  I discussed the assessment and treatment plan with the patient. The patient was provided an opportunity to ask questions and all were answered. The patient agreed with the plan and demonstrated an understanding of the instructions.   The patient was advised to call back or seek an in-person evaluation if the symptoms worsen or if the condition fails to improve as anticipated.  I provided 20 minutes of non-face-to-face time during this encounter.  Meta Hatchet, PA    11/09/2020 11:45 PM [name]  MRN:  [id number]  Chief Complaint: Follow up and medication management  HPI:   [name] [name] is a 25 year old male with a past psychiatric history significant for bipolar 2 disorder who presents to [facility] via virtual telephone visit for follow-up and medication management.  Patient is currently being managed on the following medications:  Trazodone 50 mg at bedtime Sertraline 100 mg daily Lamictal 100 mg daily  Patient states that he continues to experience both depression and anxiety.  He states that his anxiety has worsened since his last encounter and rates his current anxiety a 10 out of 10.  Patient denies any new stressors at this time.  Triggers to his anxiety include going out in public or being in crowds.   Patient's anxiety is accompanied by nausea, constant worrying, restlessness, and the fear of something bad happening. Patient endorses the following depressive symptoms: lack of interest in activities/hobbies, sleep disturbances, decreased energy, changes in appetite, and low mood.  A PHQ-9 screen was performed with the patient scoring a 20.  A GAD-7 screen was also performed with the patient scoring an 18.  Patient is alert and oriented x4, cooperative, and engaged in conversation during the encounter.  Patient rates his mood a 2 out of 3 stating that he is worried and anxious.  Patient denies suicidal or homicidal ideations.  He further denies auditory or visual hallucinations and does not appear to be responding to internal/external stimuli.  Patient endorses fluctuating sleep stating that he sometimes receives less than 4 hours of sleep while other times he may receive over 8 hours of sleep.  Patient endorses decreased appetite throughout the day and states that he is fighting hard not to lose any weight.  Patient endorses having at least 2 meals per day.  Patient endorses alcohol consumption monthly.  Patient denies tobacco use.  Patient endorses illicit drug use through the use of marijuana.  Visit Diagnosis:    ICD-10-CM   1. Bipolar 2 disorder, major depressive episode (HCC)  F31.81 sertraline (ZOLOFT) 50 MG tablet    traZODone (DESYREL) 50 MG tablet    lamoTRIgine (LAMICTAL) 25 MG tablet    ARIPiprazole (ABILIFY) 10 MG tablet      Past Psychiatric History:  One past psychiatric hospitalization at [facility] in 2015 following suicidal ideations. Has seen Dr. Tomasa Rand with [facility] in the past.  Past Medical History:  Past Medical History:  Diagnosis Date   Bipolar 1 disorder (HCC)    No past surgical history on file.  Family Psychiatric History:  Denied  Family History: No family history on file.  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    Sexual activity: Yes    Partners: Female    Birth control/protection: None  Other Topics Concern   Not on file  Social History Narrative   Not on file   Social Determinants of Health   Financial Resource Strain: High Risk   Difficulty of Paying Living Expenses: Very hard  Food Insecurity: No Food Insecurity   Worried About Programme researcher, broadcasting/film/video in the Last Year: Never true   Ran Out of Food in the Last Year: Never true  Transportation Needs: No Transportation Needs   Lack of Transportation (Medical): No   Lack of Transportation (Non-Medical): No  Physical Activity: Inactive   Days of Exercise per Week: 0 days   Minutes of Exercise per Session: 0 min  Stress: Stress Concern Present   Feeling of Stress : Very much  Social Connections: Moderately Isolated   Frequency of Communication with Friends and Family: Three times a week   Frequency of Social Gatherings with Friends and Family: Three times a week   Attends Religious Services: Never   Active Member of Clubs or Organizations: No   Attends Engineer, structural: Never   Marital Status: Living with partner    Allergies: No Known Allergies  Metabolic Disorder Labs: No results found for: HGBA1C, MPG No results found for: PROLACTIN No results found for: CHOL, TRIG, HDL, CHOLHDL, VLDL, LDLCALC No results found for: TSH  Therapeutic Level Labs: No results found for: LITHIUM No results found for: VALPROATE No components found for:  CBMZ  Current Medications: Current Outpatient Medications  Medication  Sig Dispense Refill   ARIPiprazole (ABILIFY) 10 MG tablet Take 1 tablet (10 mg total) by mouth daily. Patient to take half tablet (5 mg total) for the first week. If tolerating medication well, patient to continue taking full tablet (10 mg total) at the start of the second week. 30 tablet 1   lamoTRIgine (LAMICTAL) 25 MG tablet Take 1 tablet (25 mg total) by mouth daily. Patient to take for a week before discontinuing. 7 tablet 0   Multiple Vitamins-Minerals (MULTIVITAMIN WITH MINERALS) tablet Take 2 tablets by mouth daily. For low vitamin     sertraline (ZOLOFT) 50 MG tablet Take 3 tablets (150 mg total) by mouth daily. 90 tablet 1   traZODone (DESYREL) 50 MG tablet Take 1 tablet (50 mg total) by mouth at bedtime as needed for sleep. 30 tablet 1   No current facility-administered medications for this visit.     Musculoskeletal: Strength & Muscle Tone: Unable to assess due to telemedicine visit Gait & Station: Unable to assess due to telemedicine visit Patient leans:  Unable to assess due to telemedicine visit  Psychiatric Specialty Exam: Review of Systems  Psychiatric/Behavioral:  Positive for sleep disturbance. Negative for decreased concentration, dysphoric mood, hallucinations, self-injury and suicidal ideas. The patient is nervous/anxious. The patient is not hyperactive.    There were no vitals taken for this visit.There is no height or weight on file to calculate BMI.  General Appearance: Unable to assess due to telemedicine visit  Eye Contact:  Unable to assess due to telemedicine visit  Speech:  Clear and Coherent and Normal Rate  Volume:  Normal  Mood:  Anxious and Depressed  Affect:  Congruent and Depressed  Thought Process:  Coherent, Goal Directed, and Descriptions of Associations: Intact  Orientation:  Full (Time, Place, and Person)  Thought Content: WDL   Suicidal Thoughts:  No  Homicidal Thoughts:  No  Memory:  Immediate;   Good Recent;   Good Remote;   Good   Judgement:  Good  Insight:  Good  Psychomotor Activity:  Normal  Concentration:  Concentration: Good and Attention Span: Good  Recall:  Good  Fund of Knowledge: Good  Language: Good  Akathisia:  NA  Handed:  Right  AIMS (if indicated): not done  Assets:  Communication Skills Desire for Improvement Financial Resources/Insurance Housing Social Support Transportation  ADL's:  Intact  Cognition: WNL  Sleep:  Good   Screenings: AUDIT    Flowsheet Row [facility]  Alcohol Use Disorder Identification Test Final Score (AUDIT) 0      GAD-7    Flowsheet Row Video Visit from 11/09/2020 in [facility] Counselor from 10/28/2020 in [facility] Counselor from 09/16/2020 in [facility] Counselor from 07/12/2020 in [facility]  Total GAD-7 Score 18 19 14 18       PHQ2-9    Flowsheet Row Video Visit from 11/09/2020 in [facility] Video Visit from 09/10/2020 in [facility] Counselor from 07/12/2020 in [facility]  PHQ-2 Total Score 5 1 6   PHQ-9 Total Score 20 3 23       Flowsheet Row Video Visit from 11/09/2020 in [facility] Video Visit from 09/10/2020 in [facility] Counselor from 07/12/2020 in [facility]  C-SSRS RISK CATEGORY Low Risk Error: Q3, 4, or 5 should not be populated when Q2 is No Low Risk        Assessment and Plan:   Cardin Nitschke is a 25 year old male with a past psychiatric history significant for bipolar 2 disorder who presents to [facility] via virtual telephone visit for follow-up and medication management.  Patient continues to struggle with worsening anxiety and  depression.  Patient does not feel like his Lamictal has been helpful in the management of his symptoms. Patient was recommended increasing his sertraline dosage from 100 to 150 mg daily for the management of his depression and worsening anxiety.  Patient was also recommended Abilify 5 mg daily for a week, followed by 10 mg daily for the management of his depressive mood.  Patient was instructed to her down to 50 mg of [place] for the first week, followed by 25 mg for the second week prior to discontinuing medication. Patient was agreeable to recommendation.  Patient's medications to be e-prescribed to pharmacy of choice.  1. Bipolar 2 disorder, major depressive episode (  HCC)  - sertraline (ZOLOFT) 50 MG tablet; Take 3 tablets (150 mg total) by mouth daily.  Dispense: 90 tablet; Refill: 1 - traZODone (DESYREL) 50 MG tablet; Take 1 tablet (50 mg total) by mouth at bedtime as needed for sleep.  Dispense: 30 tablet; Refill: 1 - lamoTRIgine (LAMICTAL) 25 MG tablet; Take 1 tablet (25 mg total) by mouth daily. Patient to take for a week before discontinuing.  Dispense: 7 tablet; Refill: 0 - ARIPiprazole (ABILIFY) 10 MG tablet; Take 1 tablet (10 mg total) by mouth daily. Patient to take half tablet (5 mg total) for the first week. If tolerating medication well, patient to continue taking full tablet (10 mg total) at the start of the second week.  Dispense: 30 tablet; Refill: 1  Patient to follow up in 2 months Provider spent a total of 20 minutes with the patient/reviewing patient's chart  Meta HatchetUchenna E Tyresha Fede, PA 11/09/2020, 11:45 PM

## 2020-11-11 ENCOUNTER — Other Ambulatory Visit: Payer: Self-pay

## 2020-11-19 ENCOUNTER — Ambulatory Visit (INDEPENDENT_AMBULATORY_CARE_PROVIDER_SITE_OTHER): Payer: No Payment, Other | Admitting: Licensed Clinical Social Worker

## 2020-11-19 DIAGNOSIS — F3181 Bipolar II disorder: Secondary | ICD-10-CM | POA: Diagnosis not present

## 2020-11-19 DIAGNOSIS — F411 Generalized anxiety disorder: Secondary | ICD-10-CM

## 2020-11-19 NOTE — Progress Notes (Signed)
   THERAPIST PROGRESS NOTE  Session Time:45   Virtual Visit via Telephone Note  I connected with [name] [name] on 11/19/20 at  9:00 AM EDT by telephone and verified that I am speaking with the correct person using two identifiers.  Location: Patient: [name] [name]  Provider: Providers Home    I discussed the limitations, risks, security and privacy concerns of performing an evaluation and management service by telephone and the availability of in person appointments. I also discussed with the patient that there may be a patient responsible charge related to this service. The patient expressed understanding and agreed to proceed.     I discussed the assessment and treatment plan with the patient. The patient was provided an opportunity to ask questions and all were answered. The patient agreed with the plan and demonstrated an understanding of the instructions.   The patient was advised to call back or seek an in-person evaluation if the symptoms worsen or if the condition fails to improve as anticipated.  I provided 45 minutes of non-face-to-face time during this encounter.   [name], [name] [name]   Participation Level: Active  Behavioral Response: CasualAlertAnxious  Type of Therapy: Individual Therapy  Treatment Goals addressed: Diagnosis: Bipolar depression  Interventions: Supportive  Summary: [name] [name] is a 25 y.o. male who presents with depressed and anxious mood/affect. [name] [name] was cooperative and pleasant in today's session.   Primary stressor is housing, anxiety, and relationship. Pt reports that he has been tense and restless the past several days. [name] [name] does state he changed his medications 5 days ago. This could be the cause of an increase in anxiety. Today he scored a 19 on GAD-7. Medication takes 4 to 6 weeks to work and [name] [name] will administer another GAD-7 in 4 weeks at next appointment.   [name] reports that the house he is staying in is by his  girlfriend's parents. They are thinking of selling the house to pt girlfriend or their daughter. [name] [name] reports that his sig there now has a job and is start this upcoming Monday with benefits. Goal would be to buy the house. All 4 people currently staying in the house would remain in the house. This would essentially pay for the mortgage. [name] reports that even their friend/roommate who has been a problem would stayy but on a short term lease 4 to 6 months in case anything happened.   Suicidal/Homicidal: NAwithout intent/plan  Therapist Response:    Intervention/Plan: Supportive therapy was utilized in today session. Positive and calm tone was administered in today's session to help decrease anxiety for pt. encouragement  and praise were used to help the client focus on the good he has going for him. Motivational interview was also used to help summarize today's session  Plan: Return again in 4 weeks.     [name], [name] [name] 11/19/2020

## 2020-12-23 ENCOUNTER — Ambulatory Visit (INDEPENDENT_AMBULATORY_CARE_PROVIDER_SITE_OTHER): Payer: No Payment, Other | Admitting: Licensed Clinical Social Worker

## 2020-12-23 DIAGNOSIS — F411 Generalized anxiety disorder: Secondary | ICD-10-CM | POA: Diagnosis not present

## 2020-12-23 DIAGNOSIS — F331 Major depressive disorder, recurrent, moderate: Secondary | ICD-10-CM

## 2020-12-23 NOTE — Progress Notes (Signed)
THERAPIST PROGRESS NOTE  Session Time: 44  Virtual Visit via Video Note  I connected with [name] [name] on 12/23/20 at 10:00 AM EDT by a video enabled telemedicine application and verified that I am speaking with the correct person using two identifiers.  Location: Patient: [name] [name]  Provider: Provider home.    I discussed the limitations of evaluation and management by telemedicine and the availability of in person appointments. The patient expressed understanding and agreed to proceed.     I discussed the assessment and treatment plan with the patient. The patient was provided an opportunity to ask questions and all were answered. The patient agreed with the plan and demonstrated an understanding of the instructions.   The patient was advised to call back or seek an in-person evaluation if the symptoms worsen or if the condition fails to improve as anticipated.  I provided 45 minutes of non-face-to-face time during this encounter.   [name] Cooks, LCSW   Participation Level: Active  Behavioral Response: CasualAlertAnxious and Depressed  Type of Therapy: Individual Therapy  Treatment Goals addressed: Anxiety and Diagnosis: depression     Therapist Response: Pt was alert and oriented x 5. He was dressed casually and engaged well in therapy session. Pt presented with depressed and anxious moo /affect. He was cooperative and engaged in therapy well.   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 Pt reports that his father owns the house which they are going to sell by the end of the year. Pt wants to buy the house but neither his girlfriend nor him have a job to get a [employer]. Pt reports that the two people that could take out a loan are his brother and other roommate. Pt states that this is the same roommate that he has had problems with prior to this session for not doing anything around the house. LCSW encouraged pt to do a fair analysis on the  situation reviewing the fact that pt has not liked living with this roommate, what is it going to be like having that roommate own the house. [name] [name] reports he has reviewed this problem, but reports that there is no other choice. This is because pt girlfriend was getting a job, but pt reports she was harassed at the job. She quit within 30 days. The only source of income is pt brother and roommate.   [name] [name] reports social determinants of health stress for financials, stress/tension, and housing. LCSW spoke with pt about the miracle question using solution focused therapy. Pt stated if everything was better tomorrow, he would have financial stability. LCSW stated how can he get financials stability. [name] [name] reports by getting a job. Pt reports over the last 4 days he has applied to multiple places, and this has help give him some more purpose and decrease anxiety and depression symptoms. LCSW did administer PHQ-9 pt score a 20 and GAD-7 pt score a 15. Anxiety went from an 18 to 15 while depression stayed the same for pt.   Intervention/Plan: Plan for pt is to f/u in 4 weeks. Solution focused therapy was utilized for resources in [employer] and [employer]. Pt to f/u with 1 or both in next 30 days. LCSW utilized open ended questions, resources provided, engouements, empowerment, and advice giving in session.        Interventions: CBT and Supportive  Summary: [name] [name] is a 25 y.o. male who presents with   Suicidal/Homicidal: NAwithout intent/plan   Plan: Return again in  4 weeks.     [name] Cooks, LCSW 12/23/2020

## 2020-12-27 ENCOUNTER — Other Ambulatory Visit: Payer: Self-pay

## 2020-12-29 ENCOUNTER — Other Ambulatory Visit: Payer: Self-pay

## 2021-01-11 ENCOUNTER — Telehealth (HOSPITAL_COMMUNITY): Payer: No Payment, Other | Admitting: Physician Assistant

## 2021-01-20 ENCOUNTER — Telehealth (HOSPITAL_COMMUNITY): Payer: Self-pay | Admitting: Licensed Clinical Social Worker

## 2021-01-20 ENCOUNTER — Ambulatory Visit (HOSPITAL_COMMUNITY): Payer: No Payment, Other | Admitting: Licensed Clinical Social Worker

## 2021-01-20 NOTE — Telephone Encounter (Signed)
LCSW spoke with pt via virtual visit. Pt requested to do appointment via phone as he got a job and it requires him to drive. LCSW stated that sessions could not be completed via phone while driving due to safety concerns. Pt requested to reschedule visit on his own time as he was unaware of his work schedule.

## 2021-02-16 ENCOUNTER — Ambulatory Visit (HOSPITAL_COMMUNITY): Payer: No Payment, Other | Admitting: Licensed Clinical Social Worker

## 2021-05-10 ENCOUNTER — Telehealth (INDEPENDENT_AMBULATORY_CARE_PROVIDER_SITE_OTHER): Payer: No Payment, Other | Admitting: Physician Assistant

## 2021-05-10 DIAGNOSIS — R41840 Attention and concentration deficit: Secondary | ICD-10-CM

## 2021-05-10 DIAGNOSIS — F411 Generalized anxiety disorder: Secondary | ICD-10-CM

## 2021-05-10 DIAGNOSIS — F3181 Bipolar II disorder: Secondary | ICD-10-CM | POA: Diagnosis not present

## 2021-05-11 ENCOUNTER — Other Ambulatory Visit: Payer: Self-pay

## 2021-05-11 DIAGNOSIS — R41840 Attention and concentration deficit: Secondary | ICD-10-CM | POA: Insufficient documentation

## 2021-05-11 MED ORDER — TRAZODONE HCL 50 MG PO TABS
50.0000 mg | ORAL_TABLET | Freq: Every evening | ORAL | 1 refills | Status: DC | PRN
  Filled 2021-05-11: qty 30, 30d supply, fill #0
  Filled 2021-07-08: qty 30, 30d supply, fill #1

## 2021-05-11 MED ORDER — ATOMOXETINE HCL 40 MG PO CAPS
40.0000 mg | ORAL_CAPSULE | Freq: Every day | ORAL | 1 refills | Status: AC
  Filled 2021-05-11: qty 30, 30d supply, fill #0

## 2021-05-11 MED ORDER — FLUOXETINE HCL 10 MG PO CAPS
10.0000 mg | ORAL_CAPSULE | Freq: Every day | ORAL | 1 refills | Status: DC
  Filled 2021-05-11: qty 30, 30d supply, fill #0

## 2021-05-11 NOTE — Progress Notes (Addendum)
BH MD/PA/NP OP Progress Note  Virtual Visit via Video Note  I connected with [name] [name] on 05/10/21 at  5:00 PM EST by a video enabled telemedicine application and verified that I am speaking with the correct person using two identifiers.  Location: Patient: Home Provider: Clinic   I discussed the limitations of evaluation and management by telemedicine and the availability of in person appointments. The patient expressed understanding and agreed to proceed.  Follow Up Instructions:  I discussed the assessment and treatment plan with the patient. The patient was provided an opportunity to ask questions and all were answered. The patient agreed with the plan and demonstrated an understanding of the instructions.   The patient was advised to call back or seek an in-person evaluation if the symptoms worsen or if the condition fails to improve as anticipated.  I provided 21 minutes of non-face-to-face time during this encounter.  Meta Hatchet, PA   05/11/2021 8:26 AM [name]  MRN:  [id number]  Chief Complaint: Follow up and medication management  HPI:   [name] [name] is a 26 year old male with a past psychiatric history significant for bipolar 2 disorder who presents to [facility] behavioral health outpatient clinic via virtual video visit for follow-up and medication management.  Patient was last seen him by this provider on 11/09/2020.  At the conclusion of the patient's last encounter, patient was placed on the following medications:  Sertraline 150 mg daily Trazodone 50 mg at bedtime Abilify 10 mg daily  Patient reports that his previous medications were not helpful at all.  In regards to his mental health, patient's main concern involves his executive dysfunction.  He reports that he has taken ADHD medication in the past but has not had much success in managing his focus and concentration.  Patient endorses the following symptoms on part with ADHD: poor  management skills, obsessional over thinking shying away from activities that require mental effort, forgetfulness, and impatience.  He reports that his forgetfulness is so bad that not uncommon for him to have to repeat the same questions about a task to him.  He also states that he often has to reread lines when reading or doing an assignment.  He still endorses struggling with depression as well as worsening anxiety.  He rates his anxiety at 10 out of 10.  His current stressors involve financial instability and his girlfriend in the middle of switching jobs.  A PHQ-9 screen was performed with the patient scoring a 22.  A GAD-7 screen was also performed with the patient scoring a 19.  Patient is alert and oriented x4, calm, cooperative, and fully engaged in conversation during the encounter.  He endorses stress mood.  He denies suicidal or homicidal ideations.  He further denies auditory or visual hallucinations and does not appear to be responding to internal/external stimuli.  Patient endorses fair sleep and receives on average 3 to 5 hours of intermittent sleep.  Patient endorses fair appetite stating that he eats 2 big meals at night.  Patient endorses alcohol consumption sparingly.  He denies tobacco use.  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  Patient has been on the following medications in the past: Lamotrigine, zolpidem, and Adderall.  Visit Diagnosis:    ICD-10-CM   1. GAD (generalized anxiety disorder)  F41.1 FLUoxetine (PROZAC) 10 MG capsule    2. Bipolar 2 disorder, major depressive episode (HCC)  F31.81 traZODone (DESYREL) 50 MG tablet  FLUoxetine (PROZAC) 10 MG capsule    3. Attention and concentration deficit  R41.840 atomoxetine (STRATTERA) 40 MG capsule      Past Psychiatric History:  One [facility] following suicidal ideations. Has seen Dr. Tomasa Randunningham with [facility] in the past.  Past  Medical History:  Past Medical History:  Diagnosis Date   Bipolar 1 disorder (HCC)    No past surgical history on file.  Family Psychiatric History:  Denied  Family History: No family history on file.  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    Sexual activity: Yes    Partners: Female    Birth control/protection: None  Other Topics Concern   Not on file  Social History Narrative   Not on file   Social Determinants of Health   Financial Resource Strain: High Risk   Difficulty of Paying Living Expenses: Very hard  Food Insecurity: No Food Insecurity   Worried About Programme researcher, broadcasting/film/videounning Out of Food in the Last Year: Never true   Ran Out of Food in the Last Year: Never true  Transportation Needs: No Transportation Needs   Lack of Transportation (Medical): No   Lack of Transportation (Non-Medical): No  Physical Activity: Inactive   Days of Exercise per Week: 0 days   Minutes of Exercise per Session: 0 min  Stress: Stress Concern Present   Feeling of Stress : Very much  Social Connections: Moderately Isolated   Frequency of Communication with Friends and Family: Three times a week   Frequency of Social Gatherings with Friends and Family: Three times a week   Attends Religious Services: Never   Active Member of Clubs or Organizations: No   Attends Engineer, structuralClub or Organization Meetings: Never   Marital Status: Living with partner    Allergies: No Known Allergies  Metabolic Disorder Labs: No results found for: HGBA1C, MPG No results found for:  PROLACTIN No results found for: CHOL, TRIG, HDL, CHOLHDL, VLDL, LDLCALC No results found for: TSH  Therapeutic Level Labs: No results found for: LITHIUM No results found for: VALPROATE No components found for:  CBMZ  Current Medications: Current Outpatient Medications  Medication Sig Dispense Refill   atomoxetine (STRATTERA) 40 MG capsule Take 1 capsule (40 mg total) by mouth daily. 30 capsule 1   FLUoxetine (PROZAC) 10 MG capsule Take 1 capsule (10 mg total) by mouth daily. 30 capsule 1   ARIPiprazole (ABILIFY) 10 MG tablet Take 1 tablet (10 mg total) by mouth daily. Patient to take half tablet (5 mg total) for the first week. If tolerating medication well, patient to continue taking full tablet (10 mg total) at the start of the second week. 30 tablet 1   lamoTRIgine (LAMICTAL) 25 MG tablet Take 1 tablet (25 mg total) by mouth daily. Patient to take for a week before discontinuing. 7 tablet 0   Multiple Vitamins-Minerals (MULTIVITAMIN WITH MINERALS) tablet Take  2 tablets by mouth daily. For low vitamin     traZODone (DESYREL) 50 MG tablet Take 1 tablet (50 mg total) by mouth at bedtime as needed for sleep. 30 tablet 1   No current facility-administered medications for this visit.     Musculoskeletal: Strength & Muscle Tone: within normal limits Gait & Station: normal Patient leans: N/A  Psychiatric Specialty Exam: Review of Systems  Psychiatric/Behavioral:  Positive for decreased concentration and sleep disturbance. Negative for dysphoric mood, hallucinations, self-injury and suicidal ideas. The patient is nervous/anxious. The patient is not hyperactive.    There were no vitals taken for this visit.There is no height or weight on file to calculate BMI.  General Appearance: Casual  Eye Contact:  Good  Speech:  Clear and Coherent and Normal Rate  Volume:  Normal  Mood:  Anxious and Depressed  Affect:  Congruent and Depressed  Thought Process:  Coherent, Goal Directed, and  Descriptions of Associations: Intact  Orientation:  Full (Time, Place, and Person)  Thought Content: WDL   Suicidal Thoughts:  No  Homicidal Thoughts:  No  Memory:  Immediate;   Good Recent;   Good Remote;   Good  Judgement:  Good  Insight:  Good  Psychomotor Activity:  Normal  Concentration:  Concentration: Good and Attention Span: Good  Recall:  Good  Fund of Knowledge: Good  Language: Good  Akathisia:  NA  Handed:  Right  AIMS (if indicated): not done  Assets:  Communication Skills Desire for Improvement Financial Resources/Insurance Housing Social Support Transportation  ADL's:  Intact  Cognition: WNL  Sleep:  Fair   Screenings: AUDIT    Flowsheet Row [facility]  Alcohol Use Disorder Identification Test Final Score (AUDIT) 0      GAD-7    Flowsheet Row [facility]  Total GAD-7 Score 19 15 18 19 14       PHQ2-9    Flowsheet Row [facility]  PHQ-2 Total Score 6 6 5 1 6   PHQ-9 Total Score 22 20 20 3 23       Flowsheet Row [facility]  C-SSRS RISK CATEGORY Low Risk Low Risk Error: Q3, 4, or 5 should not be populated when Q2 is No        Assessment and Plan:   Macon LargeCarmelo Fruchter is a 26 year old male with a past psychiatric history significant for bipolar 2 disorder who [facility] virtual video visit for follow-up and medication management.  Since his last encounter, patient has discontinued taking all of his medications.  He endorses issues with his executive function, depression, and worsening anxiety.  Provider recommended Strattera 40 mg daily for the management of his poor concentration.  Patient was also recommended Prozac  10 mg daily for the management of his depressive symptoms and anxiety.  Lastly, patient was recommended trazodone 50 mg at bedtime.  1. Bipolar 2 disorder, major depressive episode (HCC)  - traZODone (DESYREL) 50 MG tablet; Take 1 tablet (50 mg total) by mouth at bedtime as needed for sleep.  Dispense: 30 tablet; Refill: 1 - FLUoxetine (PROZAC) 10 MG capsule; Take 1 capsule (10 mg total) by mouth daily.  Dispense: 30 capsule; Refill: 1  2. GAD (generalized anxiety disorder)  - FLUoxetine (PROZAC) 10 MG capsule; Take 1 capsule (10 mg total) by mouth daily.  Dispense: 30 capsule; Refill: 1  3. Attention and concentration deficit  - atomoxetine (STRATTERA) 40 MG capsule; Take 1 capsule (40 mg total) by mouth daily.  Dispense: 30 capsule; Refill: 1  Patient to follow up in 6 weeks Provider spent a total of 21 minutes with the patient/reviewing patient's chart  Meta Hatchet, PA 05/11/2021, 8:26 AM

## 2021-05-12 ENCOUNTER — Encounter (HOSPITAL_COMMUNITY): Payer: Self-pay | Admitting: Physician Assistant

## 2021-06-29 ENCOUNTER — Encounter (HOSPITAL_COMMUNITY): Payer: No Payment, Other | Admitting: Physician Assistant

## 2021-07-08 ENCOUNTER — Other Ambulatory Visit: Payer: Self-pay

## 2021-07-12 ENCOUNTER — Other Ambulatory Visit: Payer: Self-pay

## 2021-07-21 ENCOUNTER — Telehealth (INDEPENDENT_AMBULATORY_CARE_PROVIDER_SITE_OTHER): Payer: No Payment, Other | Admitting: Physician Assistant

## 2021-07-21 ENCOUNTER — Other Ambulatory Visit: Payer: Self-pay

## 2021-07-21 DIAGNOSIS — F331 Major depressive disorder, recurrent, moderate: Secondary | ICD-10-CM | POA: Diagnosis not present

## 2021-07-21 DIAGNOSIS — G479 Sleep disorder, unspecified: Secondary | ICD-10-CM | POA: Diagnosis not present

## 2021-07-21 DIAGNOSIS — F411 Generalized anxiety disorder: Secondary | ICD-10-CM

## 2021-07-21 DIAGNOSIS — R41840 Attention and concentration deficit: Secondary | ICD-10-CM | POA: Diagnosis not present

## 2021-07-21 MED ORDER — TRAZODONE HCL 100 MG PO TABS
100.0000 mg | ORAL_TABLET | Freq: Every evening | ORAL | 1 refills | Status: AC | PRN
  Filled 2021-07-21: qty 30, 30d supply, fill #0

## 2021-07-21 MED ORDER — HYDROXYZINE HCL 10 MG PO TABS
10.0000 mg | ORAL_TABLET | Freq: Three times a day (TID) | ORAL | 1 refills | Status: AC | PRN
  Filled 2021-07-21: qty 75, 25d supply, fill #0

## 2021-07-21 MED ORDER — BUPROPION HCL ER (XL) 150 MG PO TB24
150.0000 mg | ORAL_TABLET | ORAL | 1 refills | Status: AC
  Filled 2021-07-21: qty 30, 30d supply, fill #0

## 2021-07-21 NOTE — Progress Notes (Addendum)
BH MD/PA/NP OP Progress Note ? ?Virtual Visit via Telephone Note ? ?I connected with [name] [name] on 07/21/21 at  4:00 PM EDT by telephone and verified that I am speaking with the correct person using two identifiers. ? ?Location: ?Patient: Home ?Provider: Clinic ?  ?I discussed the limitations, risks, security and privacy concerns of performing an evaluation and management service by telephone and the availability of in person appointments. I also discussed with the patient that there may be a patient responsible charge related to this service. The patient expressed understanding and agreed to proceed. ? ?Follow Up Instructions: ? ?I discussed the assessment and treatment plan with the patient. The patient was provided an opportunity to ask questions and all were answered. The patient agreed with the plan and demonstrated an understanding of the instructions. ?  ?The patient was advised to call back or seek an in-person evaluation if the symptoms worsen or if the condition fails to improve as anticipated. ? ?I provided 22 minutes of non-face-to-face time during this encounter. ? ?Meta HatchetUchenna E Gisell Buehrle, PA  ? ? ?07/21/2021 4:15 PM ?[name] [name]  ?MRN:  [id number] ? ?Chief Complaint:  ?Chief Complaint  ?Patient presents with  ? Follow-up  ? Medication Management  ? ?HPI:  ? ?[name] [name] is a 26 year old male with a past psychiatric history significant for generalized anxiety disorder, major depressive disorder, sleep disturbances, and attention/concentration deficit who [facility] virtual telephone visit for follow-up and medication management.  Patient is currently being managed on the following medications: ? ?Prozac 10 mg daily ?Trazodone 50 mg at bedtime ?Strattera 40 mg daily ? ?Patient reports that his medications were more of a hindrance.  He reports that even though he experienced still thought when taking his Strattera, he felt more spacey and  associative when taking the medication.  He also denies improvement in his focus and concentration.  Patient also endorses experiencing sexual dysfunction while taking his medications.  Patient was informed that the medication most likely causing his sexual dysfunction was Prozac. ? ?Patient endorses depressive symptoms related to being at a tough point in his life.  He reports that his car recently broke down; however, he is currently interviewing for a job soon.  He reports that his depressive symptoms also stem from his executive dysfunction especially when he knows he needs to get stuff done.  Patient endorses having a lot of anxiety.  Patient denies having Medicaid.  A PHQ-9 screen was performed the patient scoring a 22.  A GAD-7 screen was also performed with the patient scoring a 19. ? ?Patient is alert and oriented x4, calm, cooperative, and fully engaged in conversation during the encounter.  Patient endorses feeling stressed and anxious.  Patient denies suicidal or homicidal ideations.  He further denies auditory or visual hallucinations and does not appear to be responding to internal/external stimuli.  Patient endorses poor sleep and receives on average 4 hours of sleep each night.  Patient endorses decreased appetite and eats on average 1-2 meals per day.  Patient endorses alcohol consumption occasionally.  Patient denies tobacco use.  Patient endorses illicit drug use in the form of marijuana. ? ?Visit Diagnosis:  ?  ICD-10-CM   ?1. GAD (generalized anxiety disorder)  F41.1 hydrOXYzine (ATARAX) 10 MG tablet  ?  ?2. Major depressive disorder, recurrent episode, moderate (HCC)  F33.1 buPROPion (WELLBUTRIN XL) 150 MG 24 hr tablet  ?  ?3. Sleep disturbances  G47.9 traZODone (DESYREL) 100 MG  tablet  ?  ?4. Attention and concentration deficit  R41.840 buPROPion (WELLBUTRIN XL) 150 MG 24 hr tablet  ?  ? ? ?Past Psychiatric History:  ?One past psychiatric hospitalization at [facility] in 2015 following suicidal  ideations. Has seen Dr. Tomasa Rand with [facility] in the past. ? ?Past Medical History:  ?Past Medical History:  ?Diagnosis Date  ? Bipolar 1 disorder (HCC)   ? History reviewed. No pertinent surgical history. ? ?Family Psychiatric History:  ?Denied ? ?Family History: History reviewed. No pertinent family history. ? ?Social History:  ?Social History  ? ?Socioeconomic History  ? Marital status: Single  ?  Spouse name: Not on file  ? Number of children: Not on file  ? Years of education: Not on file  ? Highest education level: Not on file  ?Occupational History  ? Not on file  ?Tobacco Use  ? Smoking status: Never  ? Smokeless tobacco: Never  ?Substance and Sexual Activity  ? Alcohol use: Yes  ?  Comment: monthly to bi monthly   ? Drug use: Yes  ?  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 Sexual activity: Yes  ?  Partners: Female  ?  Birth control/protection: None  ?Other Topics Concern  ? Not on file  ?Social History Narrative  ? Not on file  ? ?Social Determinants of Health  ? ?Financial Resource Strain: Not on file  ?Food Insecurity: Not on file  ?Transportation Needs: Not on file  ?Physical Activity: Not on file  ?Stress: Not on file  ?Social Connections: Not on file  ? ? ?Allergies: No Known Allergies ? ?Metabolic Disorder Labs: ?No results found for: HGBA1C, MPG ?No results found for: PROLACTIN ?No results found for: CHOL, TRIG, HDL, CHOLHDL, VLDL, LDLCALC ?No results found for: TSH ? ?Therapeutic Level Labs: ?No results found for: LITHIUM ?No results found for: VALPROATE ?No components found for:  CBMZ ? ?Current Medications: ?Current Outpatient Medications  ?Medication Sig Dispense Refill  ? buPROPion (WELLBUTRIN XL) 150 MG 24 hr tablet Take 1 tablet (150 mg total) by mouth every morning. 30 tablet 1  ? hydrOXYzine (ATARAX) 10 MG tablet Take 1 tablet (10 mg total) by mouth 3 (three) times daily as needed. 75 tablet 1  ? ARIPiprazole (ABILIFY) 10 MG tablet Take 1 tablet (10 mg  total) by mouth daily. Patient to take half tablet (5 mg total) for the first week. If tolerating medication well, patient to continue taking full tablet (10 mg total) at the start of the second week. 30 tablet 1  ? atomoxetine (STRATTERA) 40 MG capsule Take 1 capsule (40 mg total) by mouth daily. 30 capsule 1  ? lamoTRIgine (LAMICTAL) 25 MG tablet Take 1 tablet (25 mg total) by mouth daily. Patient to take for a week before discontinuing. 7 tablet 0  ? Multiple Vitamins-Minerals (MULTIVITAMIN WITH MINERALS) tablet Take 2 tablets by mouth daily. For low vitamin    ? traZODone (DESYREL) 100 MG tablet Take 1 tablet (100 mg total) by mouth at bedtime as needed for sleep. 30 tablet 1  ? ?No current facility-administered medications for this visit.  ? ? ? ?Musculoskeletal: ?Strength & Muscle Tone: Unable to assess due to telemedicine visit ?Gait & Station: Unable to assess due to telemedicine visit ?Patient leans: Unable to assess due to telemedicine visit ? ?Psychiatric Specialty Exam: ?Review of Systems  ?Psychiatric/Behavioral:  Positive for decreased concentration and sleep disturbance. Negative for dysphoric mood, hallucinations,  self-injury and suicidal ideas. The patient is nervous/anxious. The patient is not hyperactive.    ?There were no vitals taken for this visit.There is no height or weight on file to calculate BMI.  ?General Appearance: Unable to assess due to telemedicine visit  ?Eye Contact:  Unable to assess due to telemedicine visit  ?Speech:  Clear and Coherent and Normal Rate  ?Volume:  Normal  ?Mood:  Anxious and Depressed  ?Affect:  Congruent  ?Thought Process:  Coherent, Goal Directed, and Descriptions of Associations: Intact  ?Orientation:  Full (Time, Place, and Person)  ?Thought Content: WDL   ?Suicidal Thoughts:  No  ?Homicidal Thoughts:  No  ?Memory:  Immediate;   Good ?Recent;   Good ?Remote;   Good  ?Judgement:  Good  ?Insight:  Good  ?Psychomotor Activity:  Normal  ?Concentration:   Concentration: Good and Attention Span: Good  ?Recall:  Good  ?Fund of Knowledge: Good  ?Language: Good  ?Akathisia:  No  ?Handed:  Right  ?AIMS (if indicated): not done  ?Assets:  Communication Skills ?Desire for Improve

## 2021-07-22 ENCOUNTER — Other Ambulatory Visit: Payer: Self-pay

## 2021-07-22 ENCOUNTER — Encounter (HOSPITAL_COMMUNITY): Payer: Self-pay | Admitting: Physician Assistant

## 2021-07-29 ENCOUNTER — Other Ambulatory Visit: Payer: Self-pay

## 2021-09-22 ENCOUNTER — Telehealth (INDEPENDENT_AMBULATORY_CARE_PROVIDER_SITE_OTHER): Payer: No Payment, Other | Admitting: Physician Assistant

## 2021-09-22 DIAGNOSIS — G479 Sleep disorder, unspecified: Secondary | ICD-10-CM | POA: Diagnosis not present

## 2021-09-22 DIAGNOSIS — R41840 Attention and concentration deficit: Secondary | ICD-10-CM | POA: Diagnosis not present

## 2021-09-22 DIAGNOSIS — F331 Major depressive disorder, recurrent, moderate: Secondary | ICD-10-CM

## 2021-09-22 DIAGNOSIS — F411 Generalized anxiety disorder: Secondary | ICD-10-CM | POA: Diagnosis not present

## 2021-09-25 ENCOUNTER — Encounter (HOSPITAL_COMMUNITY): Payer: Self-pay | Admitting: Physician Assistant

## 2021-09-25 NOTE — Progress Notes (Cosign Needed Addendum)
[place] MD/PA/NP OP Progress Note  Virtual Visit via Telephone Note  I connected with [name] on 09/22/21 at  4:00 PM EDT by telephone and verified that I am speaking with the correct person using two identifiers.  Location: Patient: Home Provider: Clinic   I discussed the limitations, risks, security and privacy concerns of performing an evaluation and management service by telephone and the availability of in person appointments. I also discussed with the patient that there may be a patient responsible charge related to this service. The patient expressed understanding and agreed to proceed.  Follow Up Instructions:  I discussed the assessment and treatment plan with the patient. The patient was provided an opportunity to ask questions and all were answered. The patient agreed with the plan and demonstrated an understanding of the instructions.   The patient was advised to call back or seek an in-person evaluation if the symptoms worsen or if the condition fails to improve as anticipated.  I provided 22 minutes of non-face-to-face time during this encounter.  [name] Mood, PA    09/22/2021 4:15 PM [name] [name]  MRN:  [id number]  Chief Complaint:  No chief complaint on file.  HPI:   [name] [name] is a 26 year old male with a past psychiatric history significant for generalized anxiety disorder, major depressive disorder, sleep disturbances, and attention/concentration deficit who presents to [facility] via virtual telephone visit for follow-up and medication management.  Patient is currently being managed on the following medications:  Hydroxyzine 10 mg 3 times daily as needed Trazodone 100 mg at bedtime Bupropion (Wellbutrin XL) 150 mg 24-hour tablet daily  Patient informed provider that he has not picked up any of his prescriptions and has only been taking trazodone.  Patient became visibly irritable during the encounter and  informed provider that he has been going to the facility for a long time and still has not been able to receive help.  Patient states that he knows the majority of his symptoms are directly related to his executive dysfunction and knows that he needs to be on ADHD medications.    Provider informed patient that he was placed on Wellbutrin which has the indication for ADHD.  Patient states that he refused to take the medication stating that he is now more sweaty since being placed on medications while being affiliated with this facility.  Patient is also upset that provider did not call him sooner regarding medication options.  Patient refused to answer questions regarding PHQ-9/GAD-7 screen and CSSRS Risk assessment.  Patient states that he refuses to answer these questions due to nearly breaking down the last time he was asked these questions during his last encounter.  Patient is alert and oriented x4, calm, cooperative, and fully engaged in conversation during the encounter.  Patient states that he is doing pretty good but is experiencing low energy and struggling with focus.  He states that his learning disability has made it difficult for him to move forward.  Patient denies suicidal or homicidal ideations.  He further denies auditory or visual hallucinations and does not appear to be responding to internal plus external stimuli.  Patient endorses fair sleep and receives on average 4 to 6 hours of sleep each night.  Patient endorses fair appetite and eats on average 2 meals per day.  Patient endorses alcohol consumption sparingly.  Patient denies tobacco use and illicit drug use.  Visit Diagnosis:  No diagnosis found.   Past Psychiatric History:  One  past psychiatric hospitalization at [facility] in 2015 following suicidal ideations. Has seen Dr. Candis Schatz with [facility] in the past.  Past Medical History:  Past Medical History:  Diagnosis Date   Bipolar 1 disorder ([place])    History  reviewed. No pertinent surgical history.  Family Psychiatric History:  Denied  Family History: History reviewed. No pertinent family history.  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    Sexual activity: Yes    Partners: Female    Birth control/protection: None  Other Topics Concern   Not on file  Social History Narrative   Not on file   Social Determinants of Health   Financial Resource Strain: Not on file  Food Insecurity: Not on file  Transportation Needs: Not on file  Physical Activity: Not on file  Stress: Not on file  Social Connections: Not on file    Allergies: No Known Allergies  Metabolic Disorder Labs: No results found for: HGBA1C, MPG No results found for: PROLACTIN No results found for: CHOL, TRIG, HDL, CHOLHDL, VLDL, LDLCALC No results found for: TSH  Therapeutic Level Labs: No results found for: LITHIUM No results found for: VALPROATE No components found for:  CBMZ  Current Medications: Current Outpatient Medications  Medication Sig Dispense Refill   ARIPiprazole (ABILIFY) 10 MG tablet Take 1 tablet (10 mg total) by mouth daily. Patient to take half tablet (5 mg total) for the first week. If tolerating medication well, patient to continue taking full tablet (10 mg total) at the start of the second week. 30 tablet 1   atomoxetine (STRATTERA) 40 MG capsule Take 1 capsule (40 mg total) by mouth daily. 30 capsule 1   buPROPion (WELLBUTRIN XL) 150 MG 24 hr tablet Take 1 tablet (150 mg total) by mouth every morning. 30  tablet 1   hydrOXYzine (ATARAX) 10 MG tablet Take 1 tablet (10 mg total) by mouth 3 (three) times daily as needed. 75 tablet 1   lamoTRIgine (LAMICTAL) 25 MG tablet Take 1 tablet (25 mg total) by mouth daily. Patient to take for a week before discontinuing. 7 tablet 0   Multiple Vitamins-Minerals (MULTIVITAMIN WITH MINERALS) tablet Take 2 tablets by mouth daily. For low vitamin     traZODone (DESYREL) 100 MG tablet Take 1 tablet (100 mg total) by mouth at bedtime as needed for sleep. 30 tablet 1   No current facility-administered medications for this visit.     Musculoskeletal: Strength & Muscle Tone: Unable to assess due to telemedicine visit Coin: Unable to assess due to telemedicine visit Patient leans: Unable to assess due to telemedicine visit  Psychiatric Specialty Exam: Review of Systems  Psychiatric/Behavioral:  Positive for decreased concentration and sleep disturbance. Negative for dysphoric [name], hallucinations, self-injury and suicidal ideas. The patient is nervous/anxious. The patient is not hyperactive.    There were no vitals taken for this visit.There is no height or weight  on file to calculate BMI.  General Appearance: Unable to assess due to telemedicine visit  Eye Contact:  Unable to assess due to telemedicine visit  Speech:  Clear and Coherent and Normal Rate  Volume:  Normal  [name]:  Anxious and Depressed  Affect:  Congruent  Thought Process:  Coherent, Goal Directed, and Descriptions of Associations: Intact  Orientation:  Full (Time, Place, and Person)  Thought Content: WDL   Suicidal Thoughts:  No  Homicidal Thoughts:  No  Memory:  Immediate;   Good Recent;   Good Remote;   Good  Judgement:  Good  Insight:  Good  Psychomotor Activity:  Normal  Concentration:  Concentration: Good and Attention Span: Good  Recall:  Good  Fund of Knowledge: Good  Language: Good  Akathisia:  No  Handed:  Right  AIMS (if indicated): not done  Assets:   Communication Skills Desire for Improvement Financial Resources/Insurance Housing Social Support Transportation  ADL's:  Intact  Cognition: WNL  Sleep:  Poor   Screenings: AUDIT    Flowsheet Row Admission (Discharged) from 10/14/2013 in [facility]  Alcohol Use Disorder Identification Test Final Score (AUDIT) 0      GAD-7    Flowsheet Row Video Visit from 07/21/2021 in [facility] Video Visit from 05/10/2021 in [facility] Counselor from 12/23/2020 in [facility] Video Visit from 11/09/2020 in [facility] Counselor from 10/28/2020 in [facility]  Total GAD-7 Score 19 19 15 18 19       PHQ2-9    Flowsheet Row Video Visit from 07/21/2021 in [facility] Video Visit from 05/10/2021 in [facility] Counselor from 12/23/2020 in [facility] Video Visit from 11/09/2020 in [facility] Video Visit from 09/10/2020 in [place]  PHQ-2 Total Score 5 6 6 5 1   PHQ-9 Total Score 22 22 20 20 3       Flowsheet Row Video Visit from 07/21/2021 in [facility] Video Visit from 05/10/2021 in [facility] Video Visit from 11/09/2020 in [place] CATEGORY Moderate Risk Low Risk Low Risk        Assessment and Plan:   Joeph Vidal is a 26 year old male with a past psychiatric history significant for generalized anxiety disorder, major depressive disorder, sleep disturbances, and attention/concentration deficit who presents to [facility] via virtual telephone visit for follow-up and medication management.  Patient reports that he has not been taking his  medications due to believing his symptom are related to ADHD/executive dysfunction as opposed to depression or anxiety.  Provider informed patient that he would not give patient Adderall unless he had an accurate diagnosis for ADHD.  Provider to fax out patient's information to [facility] to assess for possible ADHD.  Provider also informed patient about Qelbree samples for the management of his attention/concentration issues. Provider to provide patient with a 3 week sample of the medication. Patient is agreeable to recommendation.  Patient to pick up samples from facility.  Collaboration of Care: Collaboration of Care: Medication Management AEB provider managing patient's psychiatric medications and Psychiatrist AEB patient being followed by a mental health provider  Patient/Guardian was advised Release of Information must be obtained prior to any record release in order to collaborate their care  with an outside provider. Patient/Guardian was advised if they have not already done so to contact the registration department to sign all necessary forms in order for [place] to release information regarding their care.   Consent: Patient/Guardian gives verbal consent for treatment and assignment of benefits for services provided during this visit. Patient/Guardian expressed understanding and agreed to proceed.   1. Attention and concentration deficit Patient was provided a 3-week supply of Qelbree 200 mg for the management of his attention/concentration deficit  2. GAD (generalized anxiety disorder)  3. Major depressive disorder, recurrent episode, moderate (HCC)  4. Sleep disturbances  Patient to follow up in 2 months Provider spent a total of 20 minutes with the patient/reviewing patient's chart  [name] Mood, PA 07/21/2021, 4:15 PM

## 2021-11-23 ENCOUNTER — Encounter (HOSPITAL_COMMUNITY): Payer: Self-pay

## 2021-11-23 ENCOUNTER — Telehealth (HOSPITAL_COMMUNITY): Payer: No Payment, Other | Admitting: Physician Assistant

## 2024-04-22 ENCOUNTER — Other Ambulatory Visit: Payer: Self-pay

## 2024-04-22 ENCOUNTER — Emergency Department (HOSPITAL_BASED_OUTPATIENT_CLINIC_OR_DEPARTMENT_OTHER)
Admission: EM | Admit: 2024-04-22 | Discharge: 2024-04-23 | Disposition: A | Discharge status: Home / Self Care | Attending: Emergency Medicine | Admitting: Emergency Medicine

## 2024-04-22 DIAGNOSIS — N50811 Right testicular pain: Secondary | ICD-10-CM | POA: Diagnosis present

## 2024-04-22 DIAGNOSIS — Z79899 Other long term (current) drug therapy: Secondary | ICD-10-CM | POA: Insufficient documentation

## 2024-04-22 LAB — URINALYSIS, W/ REFLEX TO CULTURE (INFECTION SUSPECTED)
Bacteria, UA: NONE SEEN
Bilirubin Urine: NEGATIVE
Glucose, UA: NEGATIVE mg/dL
Hgb urine dipstick: NEGATIVE
Ketones, ur: NEGATIVE mg/dL
Leukocytes,Ua: NEGATIVE
Nitrite: NEGATIVE
Protein, ur: NEGATIVE mg/dL
Specific Gravity, Urine: 1.017 (ref 1.005–1.030)
pH: 5 (ref 5.0–8.0)

## 2024-04-22 NOTE — ED Notes (Signed)
 Gave report to DOROTHA Renshaw RN.

## 2024-04-22 NOTE — ED Triage Notes (Addendum)
 Sharp scrotal pain into groin. US  several weeks ago for same. Comes in today for worsening pain. Some tenderness to right testicle. Unsure if swollen.

## 2024-04-22 NOTE — Discharge Instructions (Addendum)
 Your ultrasound not show any obvious cause for your pain.  There is no sign of torsion of your testicle.  Please take 2 naproxen tablets twice a day.  The brand name for naproxen is Aleve, but generic naproxen will work just as well.  Please follow-up with the urologist for further evaluation to try to find a cause for your pain.

## 2024-04-22 NOTE — ED Provider Notes (Signed)
 " [place] EMERGENCY DEPARTMENT AT [facility] Provider Note   CSN: [id number] Arrival date & time: 04/22/24  2117     Patient presents with: Testicle Pain   [name] [name] is a 28 y.o. male here for evaluation of right-sided testicular pain.  Had a similar episode however not as severe 2 weeks ago, had ultrasound at outside facility which according to patient did not show significant abnormality.  He was referred to urology however he has not heard from them.  Today he developed sudden onset severe worsening pain not similar to the pain he had a few weeks ago.  No dysuria, hematuria, abdominal pain.  He denies any fullness to his scrotum.  No history of hernias.  He rates his pain a 10/10.  Denies concern for STI.   HPI     Prior to Admission medications  Medication Sig Start Date End Date Taking? Authorizing Provider  ARIPiprazole  (ABILIFY ) 10 MG tablet Take 1 tablet (10 mg total) by mouth daily. Patient to take half tablet (5 mg total) for the first week. If tolerating medication well, patient to continue taking full tablet (10 mg total) at the start of the second week. 11/09/20 11/09/21  Nwoko, Uchenna E, PA  atomoxetine  (STRATTERA ) 40 MG capsule Take 1 capsule (40 mg total) by mouth daily. 05/11/21 05/11/22  Nwoko, Uchenna E, PA  buPROPion  (WELLBUTRIN  XL) 150 MG 24 hr tablet Take 1 tablet (150 mg total) by mouth every morning. 07/21/21 07/21/22  Nwoko, Uchenna E, PA  hydrOXYzine  (ATARAX ) 10 MG tablet Take 1 tablet (10 mg total) by mouth 3 (three) times daily as needed. 07/21/21   Nwoko, Uchenna E, PA  lamoTRIgine  (LAMICTAL ) 25 MG tablet Take 1 tablet (25 mg total) by mouth daily. Patient to take for a week before discontinuing. 11/09/20   Nwoko, Uchenna E, PA  Multiple Vitamins-Minerals (MULTIVITAMIN WITH MINERALS) tablet Take 2 tablets by mouth daily. For low vitamin 10/17/13   Kober, Charles E, PA-C  traZODone  (DESYREL ) 100 MG tablet Take 1 tablet (100 mg total) by mouth at bedtime  as needed for sleep. 07/21/21   Nwoko, Uchenna E, PA    Allergies: Patient has no known allergies.    Review of Systems  Constitutional: Negative.   HENT: Negative.    Respiratory: Negative.    Gastrointestinal: Negative.   Genitourinary:  Positive for testicular pain. Negative for decreased urine volume, difficulty urinating, dysuria, flank pain, hematuria, penile discharge, penile pain, penile swelling and scrotal swelling.  Musculoskeletal: Negative.   Skin: Negative.   Neurological: Negative.   All other systems reviewed and are negative.   Updated Vital Signs BP (!) 147/94   Pulse 91   Temp 99 F (37.2 C) (Oral)   Resp 16   SpO2 100%   Physical Exam Vitals and nursing note reviewed. Exam conducted with a chaperone present.  Constitutional:      General: He is not in acute distress.    Appearance: He is well-developed. He is not ill-appearing, toxic-appearing or diaphoretic.  HENT:     Head: Atraumatic.  Eyes:     Pupils: Pupils are equal, round, and reactive to light.  Cardiovascular:     Rate and Rhythm: Normal rate and regular rhythm.  Pulmonary:     Effort: Pulmonary effort is normal. No respiratory distress.  Abdominal:     General: There is no distension.     Palpations: Abdomen is soft.  Genitourinary:    Pubic Area: No rash.  Penis: Normal. No phimosis or paraphimosis.      Testes: Normal. Cremasteric reflex is present.        Right: Mass or tenderness not present. Cremasteric reflex is present.         Left: Mass, tenderness or swelling not present. Cremasteric reflex is present.      Epididymis:     Right: Normal.     Left: Normal.     Comments: Chaperone present in room.  No obvious masses, hernias.  Nontender scrotum on exam.  No fluctuance, induration, erythema, rashes or lesions. Musculoskeletal:        General: Normal range of motion.     Cervical back: Normal range of motion and neck supple.  Skin:    General: Skin is warm and dry.   Neurological:     General: No focal deficit present.     Mental Status: He is alert and oriented to person, place, and time.     (all labs ordered are listed, but only abnormal results are displayed) Labs Reviewed  URINALYSIS, W/ REFLEX TO CULTURE (INFECTION SUSPECTED)  GC/CHLAMYDIA PROBE AMP ([place]) NOT AT [facility]    EKG: None  Radiology: No results found.   Procedures   Medications Ordered in the ED - No data to display  28 year old here for evaluation of right sided scrotal pain.  Had a similar episode about 2 weeks ago was seen at outside facility had ultrasound according to patient which did not show a significant abnormality, urology referral was placed however he has not heard from them.  Today he developed sudden severe worsening pain that did not feel similar to a few weeks ago.  Rates his pain a 10/10.  No dysuria, hematuria.  No obvious swelling to scrotum, rashes or lesions.  No concern for STI.  Unfortunately ultrasound not available at this facility.  Had urinalysis which I personally viewed interpreted does not show any hematuria, infection, GC cultures obtained.  Given sudden severe worsening pain we will transferred [facility] emergency department for or urgent ultrasound to rule out torsion.  Dr. Raford, EDP at [facility] accepting physician.  Patient will transfer POV.  He understands not to eat, go directly to the emergency department.  We discussed risk versus benefit of POV.  Voiced understanding.                                   Medical Decision Making Amount and/or Complexity of Data Reviewed Independent Historian: friend External Data Reviewed: labs, radiology and notes. Labs: ordered. Decision-making details documented in ED Course. Radiology: ordered and independent interpretation performed. Decision-making details documented in ED Course.        Final diagnoses:  Testicular pain, right    ED Discharge Orders     None           [name] [name] A, PA-C 04/22/24 2319    [name] [name] LABOR, DO 04/27/24 1947  "

## 2024-04-23 ENCOUNTER — Emergency Department (HOSPITAL_COMMUNITY)

## 2024-04-23 NOTE — ED Provider Notes (Signed)
 28 year old male transferred from [facility] because of right testicular pain.  He had a similar episode 2 weeks ago which reportedly had a normal ultrasound.  On exam, of the right testicle is normal in position and size without any tenderness or induration.  There is some tenderness in the right inguinal canal but no palpable hernia.  Ultrasound is pending.  Ultrasound shows no acute abnormality.  Incidental finding of right epididymal cyst which is unchanged from prior, small bilateral hydroceles and small bilateral varicoceles.  These are not causes for pain.  I have independently viewed the images, and agree with the radiologist's interpretation.  On further questioning, his pain is episodic.  He will describe a very sharp pain which subsides in 1-2 minutes.  He is currently pain-free.  I have recommended that he use an athletic supporter to minimize movement of the testicle and I have recommended that he take naproxen twice a day.  I am referring him to urology for follow-up.  Results for orders placed or performed during the hospital encounter of 04/22/24  Urinalysis, w/ Reflex to Culture (Infection Suspected) -Urine, Clean Catch   Collection Time: 04/22/24  9:55 PM  Result Value Ref Range   Specimen Source URINE, CLEAN CATCH    Color, Urine YELLOW YELLOW   APPearance CLEAR CLEAR   Specific Gravity, Urine 1.017 1.005 - 1.030   pH 5.0 5.0 - 8.0   Glucose, UA NEGATIVE NEGATIVE mg/dL   Hgb urine dipstick NEGATIVE NEGATIVE   Bilirubin Urine NEGATIVE NEGATIVE   Ketones, ur NEGATIVE NEGATIVE mg/dL   Protein, ur NEGATIVE NEGATIVE mg/dL   Nitrite NEGATIVE NEGATIVE   Leukocytes,Ua NEGATIVE NEGATIVE   RBC / HPF 0-5 0 - 5 RBC/hpf   WBC, UA 0-5 0 - 5 WBC/hpf   Bacteria, UA NONE SEEN NONE SEEN   Squamous Epithelial / HPF 0-5 0 - 5 /HPF   Mucus PRESENT    US  SCROTUM W/DOPPLER Result Date: 04/23/2024 CLINICAL DATA:  Right scrotal pain. EXAM: SCROTAL ULTRASOUND DOPPLER ULTRASOUND OF THE  TESTICLES TECHNIQUE: Complete ultrasound examination of the testicles, epididymis, and other scrotal structures was performed. Color and spectral Doppler ultrasound were also utilized to evaluate blood flow to the testicles. COMPARISON:  Scrotal ultrasound 03/08/2018 FINDINGS: Right testicle Measurements: 5 x 2 x 3 cm. No mass or microlithiasis visualized.  Homogeneous echogenicity. Doppler: There is normal vascularity on color doppler examination. Spectral doppler arterial and venous waveforms are normal. Left testicle Measurements:  4.4 x 1.7 x 2.9 cm. No solid mass or microlithiasis visualized. Small intratesticular cyst is unchanged from prior exam. Otherwise homogeneous echogenicity. Doppler: There is normal vascularity on color doppler examination. Spectral doppler arterial and venous waveforms are normal. Right epididymis:  3 mm epididymal head cyst.  No hyperemia. Left epididymis:  Normal in size and appearance. Hydrocele:  Trace bilateral. Varicocele:  Present bilaterally. IMPRESSION: 1. No acute findings or explanation for pain. 2. Right epididymal head cyst, incidental, and unchanged from prior exam. 3. Small bilateral hydroceles. 4. Bilateral varicoceles. Electronically Signed   By: Andrea Gasman M.D.   On: 04/23/2024 00:46      Raford Lenis, MD 04/23/24 [phone]

## 2024-04-25 LAB — GC/CHLAMYDIA PROBE AMP (~~LOC~~) NOT AT ARMC
Chlamydia: NEGATIVE
Comment: NEGATIVE
Comment: NORMAL
Neisseria Gonorrhea: NEGATIVE
